# Patient Record
Sex: Male | Born: 1980 | Race: Black or African American | Hispanic: No | Marital: Single | Smoking: Current every day smoker
Health system: Southern US, Community
[De-identification: ages and names within clinical notes are randomized; demographics above are authoritative.]

---

## 2007-11-16 ENCOUNTER — Emergency Department (HOSPITAL_COMMUNITY): Admission: EM | Admit: 2007-11-16 | Discharge: 2007-11-16 | Payer: Self-pay | Admitting: Emergency Medicine

## 2008-08-03 ENCOUNTER — Emergency Department (HOSPITAL_COMMUNITY): Admission: EM | Admit: 2008-08-03 | Discharge: 2008-08-03 | Payer: Self-pay | Admitting: Emergency Medicine

## 2008-09-11 ENCOUNTER — Emergency Department (HOSPITAL_COMMUNITY): Admission: EM | Admit: 2008-09-11 | Discharge: 2008-09-11 | Payer: Self-pay | Admitting: Emergency Medicine

## 2008-10-01 ENCOUNTER — Emergency Department (HOSPITAL_COMMUNITY): Admission: EM | Admit: 2008-10-01 | Discharge: 2008-10-01 | Payer: Self-pay | Admitting: Emergency Medicine

## 2009-01-02 ENCOUNTER — Emergency Department (HOSPITAL_COMMUNITY): Admission: EM | Admit: 2009-01-02 | Discharge: 2009-01-02 | Payer: Self-pay | Admitting: Emergency Medicine

## 2009-08-19 ENCOUNTER — Emergency Department (HOSPITAL_COMMUNITY): Admission: EM | Admit: 2009-08-19 | Discharge: 2009-08-19 | Payer: Self-pay | Admitting: Emergency Medicine

## 2009-08-20 ENCOUNTER — Inpatient Hospital Stay (HOSPITAL_COMMUNITY): Admission: EM | Admit: 2009-08-20 | Discharge: 2009-08-22 | Payer: Self-pay | Admitting: Emergency Medicine

## 2009-12-08 ENCOUNTER — Emergency Department (HOSPITAL_COMMUNITY): Admission: EM | Admit: 2009-12-08 | Discharge: 2009-12-08 | Payer: Self-pay | Admitting: Emergency Medicine

## 2010-04-05 ENCOUNTER — Emergency Department (HOSPITAL_COMMUNITY)
Admission: EM | Admit: 2010-04-05 | Discharge: 2010-04-05 | Payer: Self-pay | Source: Home / Self Care | Admitting: Emergency Medicine

## 2010-07-18 ENCOUNTER — Emergency Department (HOSPITAL_COMMUNITY)
Admission: EM | Admit: 2010-07-18 | Discharge: 2010-07-19 | Payer: Self-pay | Source: Home / Self Care | Admitting: Emergency Medicine

## 2010-09-12 LAB — POCT I-STAT, CHEM 8
BUN: 10 mg/dL (ref 6–23)
Calcium, Ion: 0.97 mmol/L — ABNORMAL LOW (ref 1.12–1.32)
Glucose, Bld: 79 mg/dL (ref 70–99)
Hemoglobin: 16.7 g/dL (ref 13.0–17.0)
Sodium: 140 mEq/L (ref 135–145)
TCO2: 21 mmol/L (ref 0–100)

## 2010-09-12 LAB — URINALYSIS, ROUTINE W REFLEX MICROSCOPIC
Bilirubin Urine: NEGATIVE
Glucose, UA: NEGATIVE mg/dL
Glucose, UA: NEGATIVE mg/dL
Hgb urine dipstick: NEGATIVE
Ketones, ur: 15 mg/dL — AB
Ketones, ur: NEGATIVE mg/dL
Nitrite: NEGATIVE
Nitrite: NEGATIVE
Specific Gravity, Urine: 1.015 (ref 1.005–1.030)
Specific Gravity, Urine: 1.026 (ref 1.005–1.030)
Urobilinogen, UA: 0.2 mg/dL (ref 0.0–1.0)
Urobilinogen, UA: 0.2 mg/dL (ref 0.0–1.0)
pH: 6 (ref 5.0–8.0)
pH: 8.5 — ABNORMAL HIGH (ref 5.0–8.0)
pH: 8.5 — ABNORMAL HIGH (ref 5.0–8.0)

## 2010-09-12 LAB — CBC
HCT: 35.3 % — ABNORMAL LOW (ref 39.0–52.0)
MCHC: 34.9 g/dL (ref 30.0–36.0)
MCV: 96 fL (ref 78.0–100.0)
MCV: 96.5 fL (ref 78.0–100.0)
Platelets: 174 10*3/uL (ref 150–400)
Platelets: 253 10*3/uL (ref 150–400)
RBC: 3.59 MIL/uL — ABNORMAL LOW (ref 4.22–5.81)
RDW: 13.5 % (ref 11.5–15.5)
RDW: 13.5 % (ref 11.5–15.5)
WBC: 5.5 10*3/uL (ref 4.0–10.5)
WBC: 7.4 10*3/uL (ref 4.0–10.5)

## 2010-09-12 LAB — COMPREHENSIVE METABOLIC PANEL
AST: 20 U/L (ref 0–37)
Albumin: 4.2 g/dL (ref 3.5–5.2)
Alkaline Phosphatase: 80 U/L (ref 39–117)
BUN: 9 mg/dL (ref 6–23)
Creatinine, Ser: 1.21 mg/dL (ref 0.4–1.5)
Potassium: 3.1 mEq/L — ABNORMAL LOW (ref 3.5–5.1)
Sodium: 139 mEq/L (ref 135–145)
Total Bilirubin: 0.5 mg/dL (ref 0.3–1.2)

## 2010-09-12 LAB — BASIC METABOLIC PANEL
BUN: 2 mg/dL — ABNORMAL LOW (ref 6–23)
BUN: 5 mg/dL — ABNORMAL LOW (ref 6–23)
BUN: 6 mg/dL (ref 6–23)
BUN: 8 mg/dL (ref 6–23)
Calcium: 7.2 mg/dL — ABNORMAL LOW (ref 8.4–10.5)
Chloride: 105 mEq/L (ref 96–112)
Chloride: 105 mEq/L (ref 96–112)
Chloride: 111 mEq/L (ref 96–112)
Chloride: 112 mEq/L (ref 96–112)
Creatinine, Ser: 0.91 mg/dL (ref 0.4–1.5)
Creatinine, Ser: 1.17 mg/dL (ref 0.4–1.5)
GFR calc Af Amer: >60 mL/min (ref >60)
GFR calc Af Amer: >60 mL/min (ref >60)
GFR calc Af Amer: >60 mL/min (ref >60)
GFR calc non Af Amer: >60 mL/min (ref >60)
GFR calc non Af Amer: >60 mL/min (ref >60)
GFR calc non Af Amer: >60 mL/min (ref >60)
GFR calc non Af Amer: >60 mL/min (ref >60)
Glucose, Bld: 106 mg/dL — ABNORMAL HIGH (ref 70–99)
Glucose, Bld: 115 mg/dL — ABNORMAL HIGH (ref 70–99)
Potassium: 3 mEq/L — ABNORMAL LOW (ref 3.5–5.1)
Potassium: 3.4 mEq/L — ABNORMAL LOW (ref 3.5–5.1)
Potassium: 3.5 mEq/L (ref 3.5–5.1)
Potassium: 3.7 mEq/L (ref 3.5–5.1)
Potassium: 4 mEq/L (ref 3.5–5.1)
Sodium: 141 mEq/L (ref 135–145)
Sodium: 142 mEq/L (ref 135–145)
Sodium: 143 mEq/L (ref 135–145)

## 2010-09-12 LAB — POCT I-STAT 3, ART BLOOD GAS (G3+)
Acid-base deficit: 2 mmol/L (ref 0.0–2.0)
Bicarbonate: 18.4 mEq/L — ABNORMAL LOW (ref 20.0–24.0)

## 2010-09-12 LAB — SALICYLATE LEVEL
Salicylate Lvl: 12.8 mg/dL (ref 2.8–20.0)
Salicylate Lvl: 24.9 mg/dL — ABNORMAL HIGH (ref 2.8–20.0)
Salicylate Lvl: 36.3 mg/dL (ref 2.8–20.0)
Salicylate Lvl: 45.1 mg/dL (ref 2.8–20.0)
Salicylate Lvl: 52.1 mg/dL (ref 2.8–20.0)
Salicylate Lvl: 9.4 mg/dL (ref 2.8–20.0)

## 2010-09-12 LAB — RAPID URINE DRUG SCREEN, HOSP PERFORMED
Barbiturates: NOT DETECTED
Opiates: NOT DETECTED

## 2010-09-12 LAB — GLUCOSE, CAPILLARY

## 2010-09-12 LAB — MAGNESIUM: Magnesium: 1.7 mg/dL (ref 1.5–2.5)

## 2010-09-12 LAB — DIFFERENTIAL
Basophils Absolute: 0 10*3/uL (ref 0.0–0.1)
Basophils Relative: 1 % (ref 0–1)
Eosinophils Absolute: 0.1 10*3/uL (ref 0.0–0.7)
Lymphocytes Relative: 52 % — ABNORMAL HIGH (ref 12–46)
Lymphs Abs: 2.8 10*3/uL (ref 0.7–4.0)
Monocytes Relative: 12 % (ref 3–12)
Neutro Abs: 4 10*3/uL (ref 1.7–7.7)
Neutrophils Relative %: 33 % — ABNORMAL LOW (ref 43–77)

## 2010-09-12 LAB — MRSA PCR SCREENING: MRSA by PCR: NEGATIVE

## 2010-09-12 LAB — APTT: aPTT: 29 seconds (ref 24–37)

## 2010-09-12 LAB — LACTIC ACID, PLASMA: Lactic Acid, Venous: 0.7 mmol/L (ref 0.5–2.2)

## 2010-09-12 LAB — LIPASE, BLOOD: Lipase: 19 U/L (ref 11–59)

## 2010-09-12 LAB — HEMOCCULT GUIAC POC 1CARD (OFFICE): Fecal Occult Bld: POSITIVE

## 2010-09-12 LAB — URINE CULTURE: Colony Count: NO GROWTH

## 2010-09-12 LAB — ACETAMINOPHEN LEVEL: Acetaminophen (Tylenol), Serum: <10 ug/mL — ABNORMAL LOW (ref 10–30)

## 2011-10-04 ENCOUNTER — Encounter (HOSPITAL_COMMUNITY): Payer: Self-pay | Admitting: Emergency Medicine

## 2011-10-04 ENCOUNTER — Emergency Department (HOSPITAL_COMMUNITY)
Admission: EM | Admit: 2011-10-04 | Discharge: 2011-10-05 | Disposition: A | Discharge status: Home / Self Care | Payer: Self-pay | Attending: Emergency Medicine | Admitting: Emergency Medicine

## 2011-10-04 DIAGNOSIS — G43909 Migraine, unspecified, not intractable, without status migrainosus: Secondary | ICD-10-CM | POA: Insufficient documentation

## 2011-10-04 NOTE — ED Notes (Signed)
PT. REPORTS PERSISTENT MIGRAINE HEADACHE FOR SEVERAL DAYS .

## 2012-01-09 ENCOUNTER — Emergency Department (HOSPITAL_COMMUNITY)
Admission: EM | Admit: 2012-01-09 | Discharge: 2012-01-09 | Disposition: A | Discharge status: Home / Self Care | Payer: Self-pay | Attending: Emergency Medicine | Admitting: Emergency Medicine

## 2012-01-09 ENCOUNTER — Encounter (HOSPITAL_COMMUNITY): Payer: Self-pay | Admitting: Emergency Medicine

## 2012-01-09 DIAGNOSIS — G43909 Migraine, unspecified, not intractable, without status migrainosus: Secondary | ICD-10-CM | POA: Insufficient documentation

## 2012-01-09 DIAGNOSIS — F17200 Nicotine dependence, unspecified, uncomplicated: Secondary | ICD-10-CM | POA: Insufficient documentation

## 2012-01-09 DIAGNOSIS — H6690 Otitis media, unspecified, unspecified ear: Secondary | ICD-10-CM | POA: Insufficient documentation

## 2012-01-09 DIAGNOSIS — H9202 Otalgia, left ear: Secondary | ICD-10-CM

## 2012-01-09 MED ORDER — AMOXICILLIN 250 MG PO CAPS: 250.0000 mg | ORAL_CAPSULE | Freq: Two times a day (BID) | ORAL | Status: AC

## 2012-01-09 MED ORDER — IBUPROFEN 800 MG PO TABS: 800.0000 mg | ORAL_TABLET | Freq: Three times a day (TID) | ORAL | Status: AC | PRN

## 2012-01-09 MED ORDER — IBUPROFEN 400 MG PO TABS
800.0000 mg | ORAL_TABLET | Freq: Once | ORAL | Status: AC
  Administered 2012-01-09: 800 mg via ORAL
  Filled 2012-01-09: qty 2

## 2012-01-09 NOTE — ED Provider Notes (Signed)
History     CSN: [id number]  Arrival date & time 01/09/12  1947   First MD Initiated Contact with Patient 01/09/12 2011      Chief Complaint  Patient presents with  . Headache  . Blurred Vision  . Otalgia    (Consider location/radiation/quality/duration/timing/severity/associated sxs/prior treatment) HPI Comments: Patient with a history of migraines presents emergency department with chief complaint of migraine.  He states that presentation is similar to previous migraines including mild nausea, blurred vision, and headaches.  He denies any emesis, fever, night sweats or chills.  The only thing occurring that is slightly different is that he is having some left ear otalgia.  Your pain began 2 days ago and is associated with decreased ability to hear.  Patient denies any congestion, rhinorrhea, cough, shortness of breath, chest pain, ataxia, disequilibrium, vertigo.  Patient is a 31 y.o. male presenting with headaches and ear pain. The history is provided by the patient.  Headache  Associated symptoms include nausea.  Otalgia Associated symptoms include headaches.    Past Medical History  Diagnosis Date  . Migraine     History reviewed. No pertinent past surgical history.  History reviewed. No pertinent family history.  History  Substance Use Topics  . Smoking status: Current Everyday Smoker  . Smokeless tobacco: Not on file  . Alcohol Use: Yes      Review of Systems  HENT: Positive for ear pain.   Gastrointestinal: Positive for nausea.  Neurological: Positive for headaches.  All other systems reviewed and are negative.    Allergies  Review of patient's allergies indicates no known allergies.  Home Medications   Current Outpatient Rx  Name Route Sig Dispense Refill  . NAPROXEN SODIUM 220 MG PO TABS Oral Take 220 mg by mouth 3 (three) times daily as needed. For pain      BP 104/71  Pulse 69  Temp 98.7 F (37.1 C) (Oral)  Resp 18  SpO2 99%  Physical  Exam  Nursing note and vitals reviewed. Constitutional: He is oriented to person, place, and time. He appears well-developed and well-nourished. No distress.  HENT:  Head: Normocephalic and atraumatic.       Right tympanic membrane unable to be visualized due to cerumen impaction.  Left tympanic membrane bulging and erythematous.  External ear canals are normal bilaterally.  No tragal or mastoid tenderness.  Finger rub heard bilaterally Frontal and maxillary sinuses nontender to palpation.  Nose normal  Eyes: Conjunctivae and EOM are normal.  Neck: Normal range of motion.  Pulmonary/Chest: Effort normal.  Musculoskeletal: Normal range of motion.  Neurological: He is alert and oriented to person, place, and time.       Cranial nerves III through XII intact good coordination and normal gait  Skin: Skin is warm and dry. No rash noted. He is not diaphoretic.  Psychiatric: He has a normal mood and affect. His behavior is normal.    ED Course  Procedures (including critical care time)  Labs Reviewed - No data to display No results found.   No diagnosis found.    MDM  Otitis media and migraine  Patient advised to followup with ENT if your symptoms persist.  Patient will be treated with an antibiotic.Pt HA treated and improved while in ED.  Presentation is like pts typical HA and non concerning for [facility], ICH, Meningitis, or temporal arteritis. Pt is afebrile with no focal neuro deficits, nuchal rigidity, or change in vision. Pt is to follow up with  PCP to discuss prophylactic medication. Pt verbalizes understanding and is agreeable with plan to dc.          Jaci Carrel, [place] 01/09/12 2112

## 2012-01-09 NOTE — ED Provider Notes (Signed)
Medical screening examination/treatment/procedure(s) were performed by non-physician practitioner and as supervising physician I was immediately available for consultation/collaboration.  Azaiah Licciardi, MD 01/09/12 2351 

## 2012-01-09 NOTE — ED Notes (Addendum)
Pt reports headache that have occurred for years but admits to recent blurred vision and L ear pain with decreased ability to hear

## 2012-07-03 ENCOUNTER — Other Ambulatory Visit: Payer: Self-pay

## 2012-07-03 ENCOUNTER — Emergency Department (HOSPITAL_COMMUNITY)
Admission: EM | Admit: 2012-07-03 | Discharge: 2012-07-03 | Disposition: A | Discharge status: Home / Self Care | Payer: Self-pay | Attending: Emergency Medicine | Admitting: Emergency Medicine

## 2012-07-03 ENCOUNTER — Encounter (HOSPITAL_COMMUNITY): Payer: Self-pay | Admitting: Emergency Medicine

## 2012-07-03 DIAGNOSIS — F17200 Nicotine dependence, unspecified, uncomplicated: Secondary | ICD-10-CM | POA: Insufficient documentation

## 2012-07-03 DIAGNOSIS — L0390 Cellulitis, unspecified: Secondary | ICD-10-CM

## 2012-07-03 DIAGNOSIS — Z8679 Personal history of other diseases of the circulatory system: Secondary | ICD-10-CM | POA: Insufficient documentation

## 2012-07-03 DIAGNOSIS — N61 Mastitis without abscess: Secondary | ICD-10-CM | POA: Insufficient documentation

## 2012-07-03 MED ORDER — HYDROCODONE-ACETAMINOPHEN 5-325 MG PO TABS: 1.0000 | ORAL_TABLET | ORAL | Status: DC | PRN

## 2012-07-03 MED ORDER — SULFAMETHOXAZOLE-TRIMETHOPRIM 800-160 MG PO TABS: 1.0000 | ORAL_TABLET | Freq: Two times a day (BID) | ORAL | Status: DC

## 2012-07-03 NOTE — ED Provider Notes (Signed)
Medical screening examination/treatment/procedure(s) were performed by non-physician practitioner and as supervising physician I was immediately available for consultation/collaboration.   Oron Westrup L Erhard Senske, MD 07/03/12 2323 

## 2012-07-03 NOTE — ED Provider Notes (Signed)
History   This chart was scribed for non-physician practitioner working with Flint Melter, MD by Gerlean Ren, ED Scribe. This patient was seen in room TR05C/TR05C and the patient's care was started at 3:49 PM.    CSN: [id number]  Arrival date & time 07/03/12  1348   First MD Initiated Contact with Patient 07/03/12 1516      Chief Complaint  Patient presents with  . Wound Check     The history is provided by the patient. No language interpreter was used.   Thomas Ali is a 32 y.o. male who presents to the Emergency Department complaining of one week of pain over left nipple area that waxes-and-wanes in severity but is constantly present and occasionally radiates to the mid-chest.  No discharge from nipple noted.  Pt denies dyspnea, fever, nausea, emesis.  Pt is a current everyday smoker and reports alcohol use.   Past Medical History  Diagnosis Date  . Migraine     No past surgical history on file.  No family history on file.  History  Substance Use Topics  . Smoking status: Current Every Day Smoker  . Smokeless tobacco: Not on file  . Alcohol Use: Yes      Review of Systems  Constitutional: Negative for fever.  Respiratory: Negative for shortness of breath.   Gastrointestinal: Negative for nausea and vomiting.  Musculoskeletal:       Chest tenderness    Allergies  Review of patient's allergies indicates no known allergies.  Home Medications   Current Outpatient Rx  Name  Route  Sig  Dispense  Refill  . ACETAMINOPHEN 325 MG PO TABS   Oral   Take 650 mg by mouth every 6 (six) hours as needed. For pain           BP 130/84  Pulse 82  Temp 98.9 F (37.2 C)  Resp 16  Physical Exam  Nursing note and vitals reviewed. Constitutional: He is oriented to person, place, and time. He appears well-developed and well-nourished. No distress.  HENT:  Head: Normocephalic and atraumatic.  Eyes: EOM are normal.  Neck: Neck supple. No tracheal deviation  present.  Cardiovascular: Normal rate.   No murmur heard. Pulmonary/Chest: Effort normal. No respiratory distress. He exhibits tenderness.  Musculoskeletal: Normal range of motion.       Left areola is firm without induration or fluctuance, surrounding area erythematous, no nipple discharge or bleeding, no axillary lymphadenopathy, suspect early abscess or cellulitis  Neurological: He is alert and oriented to person, place, and time.  Skin: Skin is warm and dry.  Psychiatric: He has a normal mood and affect. His behavior is normal.    ED Course  Procedures (including critical care time) DIAGNOSTIC STUDIES: No O2 stat. COORDINATION OF CARE: 3:52 PM- Patient informed of clinical course, understands medical decision-making process, and agrees with plan.  Informed pt to apply warm compress and to return if symptoms continue to worsen or if fever begins.   No diagnosis found.  1. Left breast cellulitis  MDM  Cellulitis vs early abscess left breast  I personally performed the services described in this documentation, which was scribed in my presence. The recorded information has been reviewed and is accurate.      Arnoldo Hooker, PA-C 07/03/12 1608

## 2012-07-03 NOTE — ED Notes (Signed)
Pt given discharge paperwork; no additional questions by pt; pt verbalized understanding of discharge; e-signature obtained; VSS;

## 2012-07-03 NOTE — ED Notes (Signed)
States that he has lump on his chest that goes and comes swells  X 1 week  And has been having chest pain also x 2 days

## 2012-10-19 ENCOUNTER — Encounter (HOSPITAL_COMMUNITY): Payer: Self-pay | Admitting: Nurse Practitioner

## 2012-10-19 ENCOUNTER — Emergency Department (HOSPITAL_COMMUNITY)
Admission: EM | Admit: 2012-10-19 | Discharge: 2012-10-19 | Disposition: A | Discharge status: Home / Self Care | Payer: Self-pay | Attending: Emergency Medicine | Admitting: Emergency Medicine

## 2012-10-19 DIAGNOSIS — Z8679 Personal history of other diseases of the circulatory system: Secondary | ICD-10-CM | POA: Insufficient documentation

## 2012-10-19 DIAGNOSIS — H53149 Visual discomfort, unspecified: Secondary | ICD-10-CM | POA: Insufficient documentation

## 2012-10-19 DIAGNOSIS — R110 Nausea: Secondary | ICD-10-CM | POA: Insufficient documentation

## 2012-10-19 DIAGNOSIS — R51 Headache: Secondary | ICD-10-CM | POA: Insufficient documentation

## 2012-10-19 DIAGNOSIS — R5381 Other malaise: Secondary | ICD-10-CM | POA: Insufficient documentation

## 2012-10-19 DIAGNOSIS — F17200 Nicotine dependence, unspecified, uncomplicated: Secondary | ICD-10-CM | POA: Insufficient documentation

## 2012-10-19 MED ORDER — METOCLOPRAMIDE HCL 5 MG/ML IJ SOLN
10.0000 mg | Freq: Once | INTRAMUSCULAR | Status: AC
  Administered 2012-10-19: 10 mg via INTRAVENOUS
  Filled 2012-10-19: qty 2

## 2012-10-19 MED ORDER — SODIUM CHLORIDE 0.9 % IV BOLUS (SEPSIS)
500.0000 mL | Freq: Once | INTRAVENOUS | Status: AC
  Administered 2012-10-19: 500 mL via INTRAVENOUS

## 2012-10-19 MED ORDER — DIPHENHYDRAMINE HCL 50 MG/ML IJ SOLN
25.0000 mg | Freq: Once | INTRAMUSCULAR | Status: AC
  Administered 2012-10-19: 25 mg via INTRAVENOUS
  Filled 2012-10-19: qty 1

## 2012-10-19 NOTE — ED Provider Notes (Signed)
History    This chart was scribed for Renne Crigler, a non-physician practitioner working with Lyanne Co, MD by Frederik Pear, ED Scribe. This patient was seen in room TR11C/TR11C and the patient's care was started at 1504.   CSN: [id number]  Arrival date & time 10/19/12  1437   First MD Initiated Contact with Patient 10/19/12 1504      Chief Complaint  Patient presents with  . Headache    (Consider location/radiation/quality/duration/timing/severity/associated sxs/prior treatment) The history is provided by the patient and medical records. No language interpreter was used.   Thomas Ali is a 32 y.o. male who presents to the Emergency Department complaining of a severe headache around the left eye and left side of his head that radiates into his left jaw that is aggravated by chewing, which has impacted his ability to eat, and alleviated by applying pressure to the area that began suddenly last night. He also complains of mild photophobia, generalized weakness (no focal) and nausea, but denies dental pain, emesis, or phonophobia. He has a h/o of frequent mild headaches, but reports that the most recent headache of this severity was 03/04/211 for which he was admitted to the hospital for the headache as well as overdosing on Tylenol and aspirin.  He denies at treatment at home. He states that his grandmother indicated that it could be related to his sinuses, but in the ED, he denies congestion and rhinorrhea. He has no chronic medical conditions that require daily medications.   Past Medical History  Diagnosis Date  . Migraine     History reviewed. No pertinent past surgical history.  History reviewed. No pertinent family history.  History  Substance Use Topics  . Smoking status: Current Every Day Smoker  . Smokeless tobacco: Not on file  . Alcohol Use: Yes      Review of Systems  Constitutional: Negative for fever.  HENT: Negative for congestion, sore throat,  rhinorrhea, neck pain, neck stiffness, dental problem and sinus pressure.        Denies phonophobia.  Eyes: Positive for photophobia. Negative for discharge, redness and visual disturbance.  Respiratory: Negative for shortness of breath.   Cardiovascular: Negative for chest pain.  Gastrointestinal: Positive for nausea. Negative for vomiting and diarrhea.  Genitourinary: Negative for dysuria.  Musculoskeletal: Negative for back pain and gait problem.  Skin: Negative for rash.  Neurological: Positive for headaches. Negative for dizziness, syncope, speech difficulty, weakness, light-headedness and numbness.  Psychiatric/Behavioral: Negative for confusion.    Allergies  Review of patient's allergies indicates no known allergies.  Home Medications  No current outpatient prescriptions on file.  BP 123/82  Pulse 64  Temp(Src) 98.6 F (37 C) (Oral)  Resp 20  SpO2 100%  Physical Exam  Nursing note and vitals reviewed. Constitutional: He is oriented to person, place, and time. He appears well-developed and well-nourished. No distress.  HENT:  Head: Normocephalic and atraumatic.  Right Ear: Tympanic membrane, external ear and ear canal normal.  Left Ear: Tympanic membrane, external ear and ear canal normal.  Nose: Nose normal.  Mouth/Throat: Uvula is midline, oropharynx is clear and moist and mucous membranes are normal.  Eyes: Conjunctivae, EOM and lids are normal. Pupils are equal, round, and reactive to light.  Neck: Normal range of motion. Neck supple. No tracheal deviation present.  Cardiovascular: Normal rate and regular rhythm.   Pulmonary/Chest: Effort normal and breath sounds normal. No respiratory distress.  Abdominal: Soft. He exhibits no distension. There is no  tenderness.  Musculoskeletal: Normal range of motion. He exhibits no edema.       Cervical back: He exhibits normal range of motion, no tenderness and no bony tenderness.  Neurological: He is alert and oriented to  person, place, and time. He has normal strength and normal reflexes. No cranial nerve deficit or sensory deficit. He exhibits normal muscle tone. He displays a negative Romberg sign. Coordination and gait normal. GCS eye subscore is 4. GCS verbal subscore is 5. GCS motor subscore is 6.  Sensations and motor skills of the lower extremities are intact.   Skin: Skin is warm and dry.  Psychiatric: He has a normal mood and affect. His behavior is normal.    ED Course  Procedures (including critical care time)  DIAGNOSTIC STUDIES: Oxygen Saturation is 100% on room air, normal by my interpretation.    COORDINATION OF CARE:  15:11- Discussed planned course of treatment with the patient, including Reglan, Benadrul, and IV fluids, who is agreeable at this time.  15:30- Medication Orders- metoclopramide (Reglan) injection 10 mg- once, diphenhydramine (Benadrul) injection 25 mg, once, sodium chloride 0.9% bolus 500 mL- once.  16:40- Recheck- He reports that his headache is at a 0/10. He now has a family member who with him who states that he has similar headaches that can last up to several hours a day on a regular basis.  Labs Reviewed - No data to display No results found.   1. Headache    Patient seen and examined. Work-up initiated. Medications ordered.   Vital signs reviewed and are as follows: Filed Vitals:   10/19/12 1440  BP: 123/82  Pulse: 64  Temp: 98.6 F (37 C)  Resp: 20   Urged PCP f/u for mgmt and eval of chronic HA given frequent HA.   Patient urged to return with worsening symptoms or other concerns. Patient verbalized understanding and agrees with plan.      MDM  Patient with typical HA however more severe. He has had HA's this severe in past as endorsed in previous charts. Patient does not endorse true thunderclap or head trauma. Neuro exam is normal. Unclear etiology. He would benefit from PCP for frequent HA. I do not suspect sentinel bleeding or SAH. I do not  suspect meningitis. Return instructions given.    I personally performed the services described in this documentation, which was scribed in my presence. The recorded information has been reviewed and is accurate.         Renne Crigler, PA-C 10/19/12 1713

## 2012-10-19 NOTE — ED Notes (Signed)
States he has had a headache 'for a long time." states today it hurts so bad he has lost his appetite. Reports history of headaches. denies any meds PTA

## 2012-10-21 NOTE — ED Provider Notes (Signed)
Medical screening examination/treatment/procedure(s) were performed by non-physician practitioner and as supervising physician I was immediately available for consultation/collaboration.  Lyanne Co, MD 10/21/12 [phone]

## 2012-12-15 ENCOUNTER — Emergency Department (HOSPITAL_COMMUNITY)
Admission: EM | Admit: 2012-12-15 | Discharge: 2012-12-15 | Disposition: A | Discharge status: Home / Self Care | Payer: Self-pay | Attending: Emergency Medicine | Admitting: Emergency Medicine

## 2012-12-15 ENCOUNTER — Encounter (HOSPITAL_COMMUNITY): Payer: Self-pay | Admitting: *Deleted

## 2012-12-15 DIAGNOSIS — S0990XA Unspecified injury of head, initial encounter: Secondary | ICD-10-CM | POA: Insufficient documentation

## 2012-12-15 DIAGNOSIS — F17200 Nicotine dependence, unspecified, uncomplicated: Secondary | ICD-10-CM | POA: Insufficient documentation

## 2012-12-15 DIAGNOSIS — Z23 Encounter for immunization: Secondary | ICD-10-CM | POA: Insufficient documentation

## 2012-12-15 DIAGNOSIS — R110 Nausea: Secondary | ICD-10-CM | POA: Insufficient documentation

## 2012-12-15 DIAGNOSIS — F29 Unspecified psychosis not due to a substance or known physiological condition: Secondary | ICD-10-CM | POA: Insufficient documentation

## 2012-12-15 DIAGNOSIS — IMO0002 Reserved for concepts with insufficient information to code with codable children: Secondary | ICD-10-CM | POA: Insufficient documentation

## 2012-12-15 DIAGNOSIS — Z8679 Personal history of other diseases of the circulatory system: Secondary | ICD-10-CM | POA: Insufficient documentation

## 2012-12-15 DIAGNOSIS — S86911A Strain of unspecified muscle(s) and tendon(s) at lower leg level, right leg, initial encounter: Secondary | ICD-10-CM

## 2012-12-15 DIAGNOSIS — S51809A Unspecified open wound of unspecified forearm, initial encounter: Secondary | ICD-10-CM | POA: Insufficient documentation

## 2012-12-15 DIAGNOSIS — T07XXXA Unspecified multiple injuries, initial encounter: Secondary | ICD-10-CM

## 2012-12-15 MED ORDER — IBUPROFEN 800 MG PO TABS: 800.0000 mg | ORAL_TABLET | Freq: Three times a day (TID) | ORAL | Status: DC

## 2012-12-15 MED ORDER — AMOXICILLIN-POT CLAVULANATE 875-125 MG PO TABS
1.0000 | ORAL_TABLET | Freq: Once | ORAL | Status: AC
  Administered 2012-12-15: 1 via ORAL
  Filled 2012-12-15: qty 1

## 2012-12-15 MED ORDER — OXYCODONE-ACETAMINOPHEN 5-325 MG PO TABS: 2.0000 | ORAL_TABLET | ORAL | Status: DC | PRN

## 2012-12-15 MED ORDER — OXYCODONE-ACETAMINOPHEN 5-325 MG PO TABS
1.0000 | ORAL_TABLET | Freq: Once | ORAL | Status: AC
  Administered 2012-12-15: 1 via ORAL
  Filled 2012-12-15: qty 1

## 2012-12-15 MED ORDER — AMOXICILLIN-POT CLAVULANATE 875-125 MG PO TABS: 1.0000 | ORAL_TABLET | Freq: Two times a day (BID) | ORAL | Status: DC

## 2012-12-15 MED ORDER — TETANUS-DIPHTH-ACELL PERTUSSIS 5-2.5-18.5 LF-MCG/0.5 IM SUSP
0.5000 mL | Freq: Once | INTRAMUSCULAR | Status: AC
  Administered 2012-12-15: 0.5 mL via INTRAMUSCULAR
  Filled 2012-12-15: qty 0.5
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
## 2012-12-15 NOTE — Progress Notes (Signed)
Orthopedic Tech Progress Note Patient Details:  Thomas Ali 04/12/1981 [id number]  Ortho Devices Type of Ortho Device: Knee Immobilizer;Crutches Ortho Device/Splint Location: RLE Ortho Device/Splint Interventions: Ordered;Application   Jennye Moccasin 12/15/2012, 7:46 PM

## 2012-12-22 ENCOUNTER — Emergency Department (HOSPITAL_COMMUNITY): Payer: Self-pay

## 2012-12-22 ENCOUNTER — Encounter (HOSPITAL_COMMUNITY): Payer: Self-pay | Admitting: *Deleted

## 2012-12-22 ENCOUNTER — Emergency Department (HOSPITAL_COMMUNITY)
Admission: EM | Admit: 2012-12-22 | Discharge: 2012-12-22 | Disposition: A | Discharge status: Home / Self Care | Payer: Self-pay | Attending: Emergency Medicine | Admitting: Emergency Medicine

## 2012-12-22 DIAGNOSIS — M25571 Pain in right ankle and joints of right foot: Secondary | ICD-10-CM

## 2012-12-22 DIAGNOSIS — Z87828 Personal history of other (healed) physical injury and trauma: Secondary | ICD-10-CM | POA: Insufficient documentation

## 2012-12-22 DIAGNOSIS — IMO0001 Reserved for inherently not codable concepts without codable children: Secondary | ICD-10-CM | POA: Insufficient documentation

## 2012-12-22 DIAGNOSIS — F17200 Nicotine dependence, unspecified, uncomplicated: Secondary | ICD-10-CM | POA: Insufficient documentation

## 2012-12-22 DIAGNOSIS — M25579 Pain in unspecified ankle and joints of unspecified foot: Secondary | ICD-10-CM | POA: Insufficient documentation

## 2012-12-22 DIAGNOSIS — Z8679 Personal history of other diseases of the circulatory system: Secondary | ICD-10-CM | POA: Insufficient documentation

## 2012-12-22 DIAGNOSIS — M7989 Other specified soft tissue disorders: Secondary | ICD-10-CM | POA: Insufficient documentation

## 2012-12-22 NOTE — ED Notes (Signed)
The pt has had rt lower leg pain since this am.  The only injury he has is injuring his rt knee last week
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
## 2012-12-23 NOTE — ED Provider Notes (Signed)
Medical screening examination/treatment/procedure(s) were performed by non-physician practitioner and as supervising physician I was immediately available for consultation/collaboration.   Clovia Reine Y. Raylinn Kosar, MD 12/23/12 1108 

## 2012-12-24 NOTE — ED Provider Notes (Signed)
Medical screening examination/treatment/procedure(s) were performed by non-physician practitioner and as supervising physician I was immediately available for consultation/collaboration.  Khamani Daniely R. Susi Goslin, MD 12/24/12 1520 

## 2012-12-25 ENCOUNTER — Emergency Department (HOSPITAL_COMMUNITY)
Admission: EM | Admit: 2012-12-25 | Discharge: 2012-12-25 | Disposition: A | Discharge status: Home / Self Care | Payer: Self-pay | Attending: Emergency Medicine | Admitting: Emergency Medicine

## 2012-12-25 ENCOUNTER — Encounter (HOSPITAL_COMMUNITY): Payer: Self-pay | Admitting: Family Medicine

## 2012-12-25 DIAGNOSIS — M25561 Pain in right knee: Secondary | ICD-10-CM

## 2012-12-25 DIAGNOSIS — Z8679 Personal history of other diseases of the circulatory system: Secondary | ICD-10-CM | POA: Insufficient documentation

## 2012-12-25 DIAGNOSIS — F17200 Nicotine dependence, unspecified, uncomplicated: Secondary | ICD-10-CM | POA: Insufficient documentation

## 2012-12-25 DIAGNOSIS — G8921 Chronic pain due to trauma: Secondary | ICD-10-CM | POA: Insufficient documentation

## 2012-12-25 DIAGNOSIS — M25469 Effusion, unspecified knee: Secondary | ICD-10-CM | POA: Insufficient documentation

## 2012-12-25 NOTE — ED Notes (Signed)
Patient states R knee is a lot better, but his employer wanted him to get checked out because of limp.

## 2012-12-25 NOTE — ED Provider Notes (Signed)
   History    CSN: [id number] Arrival date & time 12/25/12  1238  First MD Initiated Contact with Patient 12/25/12 1307     Chief Complaint  Patient presents with  . Knee Pain   (Consider location/radiation/quality/duration/timing/severity/associated sxs/prior Treatment) HPI Thomas Ali is a 32 y.o. male who presents to ED with complaint of knee stiffness. Stats he injured it a week and a half ago. States it is feeling well except for some pain first thing is the morning and when he walks states he has a "limp" due to tightness in the back of the knee. States today was sent by his job to get "limp checked out."  Pt states he really does not have any pain. State taking ibuprofen for pain as needed. No other complaints. No new injuries.   Past Medical History  Diagnosis Date  . Migraine    History reviewed. No pertinent past surgical history. History reviewed. No pertinent family history. History  Substance Use Topics  . Smoking status: Current Every Day Smoker  . Smokeless tobacco: Not on file  . Alcohol Use: Yes    Review of Systems  Constitutional: Negative for fever and chills.  Respiratory: Negative.   Cardiovascular: Negative.   Musculoskeletal: Positive for joint swelling and arthralgias.  Skin: Negative.   Neurological: Negative for weakness and numbness.    Allergies  Review of patient's allergies indicates no known allergies.  Home Medications   Current Outpatient Rx  Name  Route  Sig  Dispense  Refill  . Ibuprofen (IBU PO)   Oral   Take 3 tablets by mouth every 6 (six) hours as needed (pain).          BP 111/68  Pulse 67  Temp(Src) 97.3 F (36.3 C)  Resp 18  SpO2 98% Physical Exam  Nursing note and vitals reviewed. Constitutional: He appears well-developed and well-nourished. No distress.  Musculoskeletal:  Normal appearing right knee.  No knee tenderness to palpation. Full ROM of the knee joint. Pain with full flexion and extension. Negative  anterior and posterior drawer signs. No laxity or pain with medial or lateral stress. Pain behind knee with straight leg raise   Neurological: He is alert.  Skin: Skin is warm and dry.    ED Course  Procedures (including critical care time) Labs Reviewed - No data to display No results found.  1. Knee pain, right     MDM  Pt with knee tightness in the posterior knee joint. No pain with flexion or extension. Hamstring muscle appears tight. Joint is stable. Instructed to continue to ice, ibuprofen. Stretches given. Follow up with orthopedics as needed.   Filed Vitals:   12/25/12 1248  BP: 111/68  Pulse: 67  Temp: 97.3 F (36.3 C)  Resp: 18     [name], PA-C 12/25/12 1414

## 2012-12-25 NOTE — ED Notes (Signed)
Per pt sts pain in right knee. sts was told he had some torn ligaments in his knee. sts there is some swelling. sts job sent here here because he has a limp and they want to make sure it is healing.

## 2012-12-26 NOTE — ED Provider Notes (Signed)
Medical screening examination/treatment/procedure(s) were performed by non-physician practitioner and as supervising physician I was immediately available for consultation/collaboration.   Carleene Cooper III, MD 12/26/12 [phone]

## 2013-03-11 ENCOUNTER — Encounter (HOSPITAL_COMMUNITY): Payer: Self-pay | Admitting: Emergency Medicine

## 2013-03-11 ENCOUNTER — Emergency Department (HOSPITAL_COMMUNITY)
Admission: EM | Admit: 2013-03-11 | Discharge: 2013-03-11 | Disposition: A | Discharge status: Home / Self Care | Payer: Self-pay | Attending: Emergency Medicine | Admitting: Emergency Medicine

## 2013-03-11 DIAGNOSIS — F17200 Nicotine dependence, unspecified, uncomplicated: Secondary | ICD-10-CM | POA: Insufficient documentation

## 2013-03-11 DIAGNOSIS — H9209 Otalgia, unspecified ear: Secondary | ICD-10-CM | POA: Insufficient documentation

## 2013-03-11 DIAGNOSIS — H9202 Otalgia, left ear: Secondary | ICD-10-CM

## 2013-03-11 DIAGNOSIS — Z8679 Personal history of other diseases of the circulatory system: Secondary | ICD-10-CM | POA: Insufficient documentation

## 2013-03-11 DIAGNOSIS — L02219 Cutaneous abscess of trunk, unspecified: Secondary | ICD-10-CM | POA: Insufficient documentation

## 2013-03-11 DIAGNOSIS — L02213 Cutaneous abscess of chest wall: Secondary | ICD-10-CM

## 2013-03-11 MED ORDER — SULFAMETHOXAZOLE-TRIMETHOPRIM 800-160 MG PO TABS: 1.0000 | ORAL_TABLET | Freq: Two times a day (BID) | ORAL | Status: DC

## 2013-03-11 MED ORDER — ANTIPYRINE-BENZOCAINE 5.4-1.4 % OT SOLN
3.0000 [drp] | OTIC | Status: DC | PRN
  Administered 2013-03-11: 3 [drp] via OTIC
  Filled 2013-03-11: qty 10

## 2013-03-11 NOTE — ED Notes (Signed)
Pt is c/o left ear pain that started yesterday, pt also has a red  area to the left nipple area that has been there about 1 week, pt uses ear lugs at work

## 2013-03-11 NOTE — ED Notes (Signed)
Pt alert and ambulatory upon discharge, dc'd home with all belongings, pt verbalizes understanding of discharge instructions, 1 new rx given and 1 rx sent home with pt

## 2013-03-11 NOTE — ED Provider Notes (Signed)
CSN: [id number]     Arrival date & time 03/11/13  1338 History  This chart was scribed for Felicie Morn, NP, working with Juliet Rude. Rubin Payor, MD by Blanchard Kelch, ED Scribe. This patient was seen in room TR08C/TR08C and the patient's care was started at 3:03 PM.    Chief Complaint  Patient presents with  . Otalgia  . Abscess    Patient is a 32 y.o. male presenting with ear pain and abscess. The history is provided by the patient. No language interpreter was used.  Otalgia Location:  Left Onset quality:  Sudden Duration:  3 days Timing:  Constant Chronicity:  New Associated symptoms: no fever   Abscess Location:  Torso Torso abscess location:  L chest Abscess quality: painful   Duration:  1 week Chronicity:  New Associated symptoms: no fever     HPI Comments: Thomas Ali is a 31 y.o. male who presents to the Emergency Department complaining of constant, painful abscess on left chest near his nipple that began about a week ago. He has a history of abscesses, with the last one being on his left leg. He also complains of left ear pain that began three days ago. He uses ear plugs while at work. He denies fever.   Past Medical History  Diagnosis Date  . Migraine    History reviewed. No pertinent past surgical history. No family history on file. History  Substance Use Topics  . Smoking status: Current Every Day Smoker  . Smokeless tobacco: Not on file  . Alcohol Use: Yes    Review of Systems  Constitutional: Negative for fever.  HENT: Positive for ear pain.   All other systems reviewed and are negative.    Allergies  Review of patient's allergies indicates no known allergies.  Home Medications   Current Outpatient Rx  Name  Route  Sig  Dispense  Refill  . Aspirin-Salicylamide-Caffeine (BC HEADACHE POWDER PO)   Oral   Take 1 each by mouth daily as needed (pain).          Triage Vitals: BP 116/79  Pulse 69  Temp(Src) 97.6 F (36.4 C) (Oral)  Resp 16   SpO2 100%  Physical Exam  Nursing note and vitals reviewed. Constitutional: He is oriented to person, place, and time. He appears well-developed and well-nourished. No distress.  HENT:  Head: Normocephalic and atraumatic.  Right Ear: Tympanic membrane and ear canal normal.  Left Ear: Tympanic membrane and ear canal normal.  Wax buildup in right canal.  Eyes: EOM are normal.  Neck: Neck supple. No tracheal deviation present.  Cardiovascular: Normal rate and regular rhythm.   Pulmonary/Chest: Effort normal and breath sounds normal. No respiratory distress.  Musculoskeletal: Normal range of motion.  Neurological: He is alert and oriented to person, place, and time.  Skin: Skin is warm and dry.  Abscess on left chest near nipple.  Psychiatric: He has a normal mood and affect. His behavior is normal.    ED Course  Procedures (including critical care time)  DIAGNOSTIC STUDIES: Oxygen Saturation is 100% on room air, normal by my interpretation.    COORDINATION OF CARE:  3:08 PM -Will perform I&D. Patient verbalizes understanding and agrees with treatment plan.   INCISION AND DRAINAGE Performed by: Felicie Morn, NP Consent: Verbal consent obtained. Risks and benefits: risks, benefits and alternatives were discussed Type: abscess  Body area: left chest wall adjacent to areola Anesthesia: local infiltration  Incision was made with a scalpel.  Local  anesthetic: lidocaine 2%   Anesthetic total: 3ml  Complexity: complex  Blunt dissection to break up loculations  Drainage: purulent  Drainage amount: copious  Patient tolerance: Patient tolerated the procedure well with no immediate complications.     Labs Review Labs Reviewed - No data to display Imaging Review No results found.  MDM  Chest wall abscess. Otalgia.   I personally performed the services described in this documentation, which was scribed in my presence. The recorded information has been reviewed and  is accurate.    Jimmye Norman, NP 03/11/13 [phone]

## 2013-03-12 NOTE — ED Provider Notes (Signed)
Medical screening examination/treatment/procedure(s) were performed by non-physician practitioner and as supervising physician I was immediately available for consultation/collaboration.  Juliet Rude. Rubin Payor, MD 03/12/13 4540

## 2014-01-26 ENCOUNTER — Emergency Department (HOSPITAL_COMMUNITY): Payer: BC Managed Care – PPO

## 2014-01-26 ENCOUNTER — Encounter (HOSPITAL_COMMUNITY): Payer: Self-pay | Admitting: Emergency Medicine

## 2014-01-26 ENCOUNTER — Emergency Department (HOSPITAL_COMMUNITY)
Admission: EM | Admit: 2014-01-26 | Discharge: 2014-01-26 | Disposition: A | Discharge status: Home / Self Care | Payer: BC Managed Care – PPO | Attending: Emergency Medicine | Admitting: Emergency Medicine

## 2014-01-26 DIAGNOSIS — Y9289 Other specified places as the place of occurrence of the external cause: Secondary | ICD-10-CM | POA: Diagnosis not present

## 2014-01-26 DIAGNOSIS — Z87891 Personal history of nicotine dependence: Secondary | ICD-10-CM | POA: Insufficient documentation

## 2014-01-26 DIAGNOSIS — M25531 Pain in right wrist: Secondary | ICD-10-CM

## 2014-01-26 DIAGNOSIS — Z7982 Long term (current) use of aspirin: Secondary | ICD-10-CM | POA: Insufficient documentation

## 2014-01-26 DIAGNOSIS — Z79899 Other long term (current) drug therapy: Secondary | ICD-10-CM | POA: Insufficient documentation

## 2014-01-26 DIAGNOSIS — S59909A Unspecified injury of unspecified elbow, initial encounter: Secondary | ICD-10-CM | POA: Insufficient documentation

## 2014-01-26 DIAGNOSIS — S59919A Unspecified injury of unspecified forearm, initial encounter: Principal | ICD-10-CM

## 2014-01-26 DIAGNOSIS — S6990XA Unspecified injury of unspecified wrist, hand and finger(s), initial encounter: Secondary | ICD-10-CM | POA: Insufficient documentation

## 2014-01-26 DIAGNOSIS — G43909 Migraine, unspecified, not intractable, without status migrainosus: Secondary | ICD-10-CM | POA: Diagnosis not present

## 2014-01-26 DIAGNOSIS — Y9389 Activity, other specified: Secondary | ICD-10-CM | POA: Insufficient documentation

## 2014-01-26 DIAGNOSIS — W240XXA Contact with lifting devices, not elsewhere classified, initial encounter: Secondary | ICD-10-CM | POA: Diagnosis not present

## 2014-01-26 MED ORDER — OXYCODONE-ACETAMINOPHEN 5-325 MG PO TABS
1.0000 | ORAL_TABLET | Freq: Once | ORAL | Status: AC
  Administered 2014-01-26: 1 via ORAL
  Filled 2014-01-26: qty 1

## 2014-01-26 MED ORDER — GABAPENTIN 300 MG PO CAPS: ORAL_CAPSULE | ORAL | Status: DC

## 2014-01-26 NOTE — ED Notes (Signed)
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 States pain, no swelling, can move all fingers.

## 2014-01-26 NOTE — Progress Notes (Signed)
Orthopedic Tech Progress Note Patient Details:  Thomas Ali Z Ayer 1981/03/02 [id number]  Ortho Devices Type of Ortho Device: Velcro wrist splint Ortho Device/Splint Location: rue Ortho Device/Splint Interventions: Application   Nikki DomCrawford, Rilda Bulls 01/26/2014, 7:44 PM

## 2014-01-26 NOTE — ED Provider Notes (Signed)
CSN: [id number]     Arrival date & time 01/26/14  1637 History  This chart was scribed for Terri Piedraourtney Forcucci, PA-C working with  Ethelda ChickMartha K Linker, MD by Evon Slackerrance Branch, ED Scribe. This patient was seen in room TR11C/TR11C and the patient's care was started at 6:46 PM.    Chief Complaint  Patient presents with  . Hand Pain   Patient is a 33 y.o. male presenting with hand pain. The history is provided by the patient. No language interpreter was used.  Hand Pain  Hand Pain Associated symptoms include arthralgias. Pertinent negatives include no joint swelling.   HPI Comments: Thomas Ali is a 33 y.o. male who presents to the Emergency Department complaining of burning right hand pain onset 5 days prior. He states he initially hit his forearm on a piece of metal but is feeling most of his pain in his hand. He states that the pain has been progressively getting worse. He states he feels the most pain in his 1st and 2nd digit. Pt states he has some associated numbness in his 1st digit as well. He states his pain severity was 9/10. He states he has been applying icy hot with no relief. He states he has taken aleve with no relief.   Past Medical History  Diagnosis Date  . Migraine    History reviewed. No pertinent past surgical history. No family history on file. History  Substance Use Topics  . Smoking status: Former Games developermoker  . Smokeless tobacco: Not on file  . Alcohol Use: Yes    Review of Systems  Musculoskeletal: Positive for arthralgias. Negative for joint swelling.  All other systems reviewed and are negative.   Allergies  Review of patient's allergies indicates no known allergies.  Home Medications   Prior to Admission medications   Medication Sig Start Date End Date Taking? Authorizing Provider  Aspirin-Salicylamide-Caffeine (BC HEADACHE POWDER PO) Take 1 each by mouth daily as needed (pain).   Yes Historical Provider, MD  gabapentin (NEURONTIN) 300 MG capsule Take 1  pill on day one, take 2 pills on day 2, take 3 pills on day 3 and then continue to take 3 times daily 01/26/14   Makailee Nudelman A Forcucci, PA-C   Triage Vitals: BP 115/82  Pulse 62  Temp(Src) 98.3 F (36.8 C) (Oral)  Resp 15  Wt 135 lb (61.236 kg)  SpO2 100%  Physical Exam  Nursing note and vitals reviewed. Constitutional: He is oriented to person, place, and time. He appears well-developed and well-nourished. No distress.  HENT:  Head: Normocephalic and atraumatic.  Mouth/Throat: Oropharynx is clear and moist. No oropharyngeal exudate.  Eyes: Conjunctivae are normal. No scleral icterus.  Neck: Normal range of motion. Neck supple. No JVD present. No thyromegaly present.  Cardiovascular: Normal rate, regular rhythm, normal heart sounds and intact distal pulses.  Exam reveals no gallop and no friction rub.   No murmur heard. Pulses:      Radial pulses are 2+ on the right side, and 2+ on the left side.  Pulmonary/Chest: Effort normal and breath sounds normal. No respiratory distress. He has no wheezes. He has no rales. He exhibits no tenderness.  Musculoskeletal: Normal range of motion.       Right wrist: Normal. He exhibits normal range of motion, no tenderness, no bony tenderness, no swelling, no effusion, no crepitus, no deformity and no laceration.       Right hand: He exhibits normal range of motion, no tenderness, no bony tenderness,  normal two-point discrimination, normal capillary refill, no deformity, no laceration and no swelling. Normal sensation noted. Decreased sensation is not present in the ulnar distribution, is not present in the medial distribution and is not present in the radial distribution. Normal strength noted. He exhibits no finger abduction, no thumb/finger opposition and no wrist extension trouble.  Lymphadenopathy:    He has no cervical adenopathy.  Neurological: He is alert and oriented to person, place, and time.  Skin: Skin is warm and dry. He is not diaphoretic.   Psychiatric: He has a normal mood and affect. His behavior is normal. Judgment and thought content normal.    ED Course  Procedures (including critical care time) DIAGNOSTIC STUDIES: Oxygen Saturation is 100% on RA, normal by my interpretation.    COORDINATION OF CARE: 7:16 PM-Discussed treatment plan which includes hand splint, pain medication, and orthopedic referral with pt at bedside and pt agreed to plan.     Labs Review Labs Reviewed - No data to display  Imaging Review Dg Forearm Right  01/26/2014   CLINICAL DATA:  Metal object dropped on to right arm. Radial side arm pain.  EXAM: RIGHT FOREARM - 2 VIEW  COMPARISON:  None.  FINDINGS: There is no evidence of fracture or other focal bone lesions. Soft tissues are unremarkable.  IMPRESSION: No significant abnormality identified.   Electronically Signed   By: Herbie Baltimore M.D.   On: 01/26/2014 17:43   Dg Hand Complete Right  01/26/2014   CLINICAL DATA:  Injury.  EXAM: RIGHT HAND - COMPLETE 3+ VIEW  COMPARISON:  None.  FINDINGS: There is no evidence of fracture or dislocation. There is no evidence of arthropathy or other focal bone abnormality. Soft tissues are unremarkable.  IMPRESSION: Negative.   Electronically Signed   By: Elberta Fortis M.D.   On: 01/26/2014 17:39     EKG Interpretation None      MDM   Final diagnoses:  Right wrist pain    Patient is a 33 y.o. Male who presents to the ED with right wrist pain after contusion to the right arm.  Physical exam reveals a neurovascularly intact hand.  Xrays here are negative at this time.  Given history suspect possible nerve contusion.  Will place in a Brace for comfort and have given gabapentin prescription.  Patient has aleve at home and was instructed to take 500 mg BID AC.  Patient to follow-up with ortho.  Patient is stable for discharge at this time.   I personally performed the services described in this documentation, which was scribed in my presence. The  recorded information has been reviewed and is accurate.      Eben Burow, PA-C 01/27/14 [phone]

## 2014-01-26 NOTE — Discharge Instructions (Signed)

## 2014-01-27 NOTE — ED Provider Notes (Signed)
Medical screening examination/treatment/procedure(s) were performed by non-physician practitioner and as supervising physician I was immediately available for consultation/collaboration.   EKG Interpretation None       Martha K Linker, MD 01/27/14 1509 

## 2014-05-05 ENCOUNTER — Emergency Department (HOSPITAL_COMMUNITY)
Admission: EM | Admit: 2014-05-05 | Discharge: 2014-05-06 | Disposition: A | Discharge status: Home / Self Care | Payer: BC Managed Care – PPO | Attending: Emergency Medicine | Admitting: Emergency Medicine

## 2014-05-05 ENCOUNTER — Emergency Department (HOSPITAL_COMMUNITY): Payer: BC Managed Care – PPO

## 2014-05-05 ENCOUNTER — Encounter (HOSPITAL_COMMUNITY): Payer: Self-pay | Admitting: Radiology

## 2014-05-05 DIAGNOSIS — S61412A Laceration without foreign body of left hand, initial encounter: Secondary | ICD-10-CM | POA: Diagnosis not present

## 2014-05-05 DIAGNOSIS — S3121XA Laceration without foreign body of penis, initial encounter: Secondary | ICD-10-CM | POA: Diagnosis not present

## 2014-05-05 DIAGNOSIS — Z7982 Long term (current) use of aspirin: Secondary | ICD-10-CM | POA: Insufficient documentation

## 2014-05-05 DIAGNOSIS — Z87891 Personal history of nicotine dependence: Secondary | ICD-10-CM | POA: Insufficient documentation

## 2014-05-05 DIAGNOSIS — Y9389 Activity, other specified: Secondary | ICD-10-CM | POA: Diagnosis not present

## 2014-05-05 DIAGNOSIS — Z23 Encounter for immunization: Secondary | ICD-10-CM | POA: Diagnosis not present

## 2014-05-05 DIAGNOSIS — G43909 Migraine, unspecified, not intractable, without status migrainosus: Secondary | ICD-10-CM | POA: Insufficient documentation

## 2014-05-05 DIAGNOSIS — Z79899 Other long term (current) drug therapy: Secondary | ICD-10-CM | POA: Insufficient documentation

## 2014-05-05 DIAGNOSIS — R000 Tachycardia, unspecified: Secondary | ICD-10-CM | POA: Diagnosis not present

## 2014-05-05 DIAGNOSIS — Y92410 Unspecified street and highway as the place of occurrence of the external cause: Secondary | ICD-10-CM | POA: Diagnosis not present

## 2014-05-05 DIAGNOSIS — S0181XA Laceration without foreign body of other part of head, initial encounter: Secondary | ICD-10-CM | POA: Insufficient documentation

## 2014-05-05 DIAGNOSIS — Y998 Other external cause status: Secondary | ICD-10-CM | POA: Diagnosis not present

## 2014-05-05 DIAGNOSIS — S0990XA Unspecified injury of head, initial encounter: Secondary | ICD-10-CM | POA: Diagnosis present

## 2014-05-05 LAB — CBC
HCT: 42.9 % (ref 39.0–52.0)
Hemoglobin: 15.1 g/dL (ref 13.0–17.0)
MCH: 32.2 pg (ref 26.0–34.0)
MCHC: 35.2 g/dL (ref 30.0–36.0)
MCV: 91.5 fL (ref 78.0–100.0)
PLATELETS: 226 10*3/uL (ref 150–400)
RBC: 4.69 MIL/uL (ref 4.22–5.81)
RDW: 12.8 % (ref 11.5–15.5)
WBC: 7 10*3/uL (ref 4.0–10.5)

## 2014-05-05 LAB — COMPREHENSIVE METABOLIC PANEL
ALK PHOS: 71 U/L (ref 39–117)
ALT: 240 U/L — ABNORMAL HIGH (ref 0–53)
ANION GAP: 15 (ref 5–15)
AST: 284 U/L — AB (ref 0–37)
Albumin: 4.1 g/dL (ref 3.5–5.2)
BILIRUBIN TOTAL: 0.5 mg/dL (ref 0.3–1.2)
BUN: 8 mg/dL (ref 6–23)
CHLORIDE: 106 meq/L (ref 96–112)
CO2: 22 meq/L (ref 19–32)
CREATININE: 0.85 mg/dL (ref 0.50–1.35)
Calcium: 9.1 mg/dL (ref 8.4–10.5)
GFR calc Af Amer: >90 mL/min (ref >90)
Glucose, Bld: 141 mg/dL — ABNORMAL HIGH (ref 70–99)
POTASSIUM: 3.8 meq/L (ref 3.7–5.3)
Sodium: 143 mEq/L (ref 137–147)
Total Protein: 7.2 g/dL (ref 6.0–8.3)

## 2014-05-05 LAB — SAMPLE TO BLOOD BANK

## 2014-05-05 LAB — PROTIME-INR
INR: 1.07 (ref 0.00–1.49)
Prothrombin Time: 14 seconds (ref 11.6–15.2)

## 2014-05-05 LAB — ETHANOL: Alcohol, Ethyl (B): 279 mg/dL — ABNORMAL HIGH (ref 0–11)

## 2014-05-05 MED ORDER — TETANUS-DIPHTH-ACELL PERTUSSIS 5-2.5-18.5 LF-MCG/0.5 IM SUSP
0.5000 mL | Freq: Once | INTRAMUSCULAR | Status: AC
  Administered 2014-05-06: 0.5 mL via INTRAMUSCULAR
  Filled 2014-05-05: qty 0.5

## 2014-05-05 MED ORDER — IOHEXOL 300 MG/ML  SOLN
100.0000 mL | Freq: Once | INTRAMUSCULAR | Status: AC | PRN
  Administered 2014-05-05: 100 mL via INTRAVENOUS

## 2014-05-05 NOTE — ED Notes (Signed)
Pt placed into gown and on monitor upon arrival to room. Pt monitored by blood pressure, pulse ox, and 12 lead.  

## 2014-05-05 NOTE — Progress Notes (Signed)
Pt requested to call his mother. Phone number listed in emergency contact is disconnected. Pt notified but his follow-up requests don't cohere. Cell phone not on-hand. Pt's makes repeated references to "Lowella Bandyikki" but can give no information concerning her.   Gala RomneyBrown, Abdullahi Vallone J, Chaplain 05/05/2014
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
## 2014-05-06 LAB — URINALYSIS, ROUTINE W REFLEX MICROSCOPIC
Bilirubin Urine: NEGATIVE
GLUCOSE, UA: NEGATIVE mg/dL
Ketones, ur: NEGATIVE mg/dL
LEUKOCYTES UA: NEGATIVE
Nitrite: NEGATIVE
PH: 6.5 (ref 5.0–8.0)
PROTEIN: NEGATIVE mg/dL
Specific Gravity, Urine: <1.005 — ABNORMAL LOW (ref 1.005–1.030)
Urobilinogen, UA: 0.2 mg/dL (ref 0.0–1.0)

## 2014-05-06 LAB — URINE MICROSCOPIC-ADD ON

## 2014-05-06 MED ORDER — ONDANSETRON 4 MG PO TBDP: 4.0000 mg | ORAL_TABLET | Freq: Three times a day (TID) | ORAL | Status: DC | PRN

## 2014-05-06 MED ORDER — LIDOCAINE HCL (PF) 1 % IJ SOLN
30.0000 mL | Freq: Once | INTRAMUSCULAR | Status: AC
  Administered 2014-05-06: 30 mL
  Filled 2014-05-06: qty 30

## 2014-05-06 MED ORDER — OXYCODONE-ACETAMINOPHEN 5-325 MG PO TABS: 2.0000 | ORAL_TABLET | ORAL | Status: DC | PRN

## 2014-05-06 MED ORDER — MORPHINE SULFATE 4 MG/ML IJ SOLN
4.0000 mg | Freq: Once | INTRAMUSCULAR | Status: AC
  Administered 2014-05-06: 4 mg via INTRAVENOUS
  Filled 2014-05-06: qty 1

## 2014-05-06 MED ORDER — OXYCODONE-ACETAMINOPHEN 5-325 MG PO TABS
1.0000 | ORAL_TABLET | Freq: Once | ORAL | Status: AC
  Administered 2014-05-06: 1 via ORAL
  Filled 2014-05-06: qty 1

## 2014-05-06 MED ORDER — NAPROXEN 500 MG PO TABS: 500.0000 mg | ORAL_TABLET | Freq: Two times a day (BID) | ORAL | Status: DC

## 2014-05-06 MED ORDER — SODIUM CHLORIDE 0.9 % IV BOLUS (SEPSIS)
1000.0000 mL | Freq: Once | INTRAVENOUS | Status: AC
  Administered 2014-05-06: 1000 mL via INTRAVENOUS

## 2014-05-06 MED ORDER — MORPHINE SULFATE 4 MG/ML IJ SOLN
4.0000 mg | INTRAMUSCULAR | Status: DC | PRN
  Administered 2014-05-06: 4 mg via INTRAVENOUS
  Filled 2014-05-06: qty 1

## 2014-05-06 MED ORDER — ONDANSETRON HCL 4 MG/2ML IJ SOLN
4.0000 mg | Freq: Once | INTRAMUSCULAR | Status: AC
  Administered 2014-05-06: 4 mg via INTRAVENOUS
  Filled 2014-05-06: qty 2

## 2014-05-06 NOTE — ED Provider Notes (Signed)
10:40 AM:  Patient reevaluated multiple times. Complains of pain and nausea. Medicated. Has abrasion to his left shoulder and right hip area and no bonyabnormalityis noted on exam 4 on radiographs. Family is here. Patient is ambulatory. Taking by mouth. Plan is discharge home. Suture removal in 10 days. Rx: Percocet, naproxen, Zofran.  Rolland PorterMark Klani Caridi, MD 05/06/14 [phone]

## 2014-05-06 NOTE — ED Notes (Signed)
MD at bedside suturing

## 2014-05-06 NOTE — ED Notes (Signed)
Family at beside. Family given emotional support. 
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
## 2014-05-06 NOTE — ED Notes (Signed)
Family at bedside. 

## 2014-05-06 NOTE — ED Notes (Addendum)
Pt given paper scrubs; able to dress himself; drinking apple juice. There were no belongings at bedside.

## 2014-05-06 NOTE — ED Notes (Signed)
This RN walked by room and pt standing up in with urinal in hand and side rails still up. Bed cleaned, and pt placed back in the bed and back on the monitor.

## 2014-05-18 ENCOUNTER — Emergency Department (HOSPITAL_COMMUNITY): Payer: BC Managed Care – PPO

## 2014-05-18 ENCOUNTER — Emergency Department (HOSPITAL_COMMUNITY)
Admission: EM | Admit: 2014-05-18 | Discharge: 2014-05-18 | Disposition: A | Discharge status: Home / Self Care | Payer: BC Managed Care – PPO | Attending: Emergency Medicine | Admitting: Emergency Medicine

## 2014-05-18 ENCOUNTER — Encounter (HOSPITAL_COMMUNITY): Payer: Self-pay | Admitting: Family Medicine

## 2014-05-18 DIAGNOSIS — Z8679 Personal history of other diseases of the circulatory system: Secondary | ICD-10-CM | POA: Insufficient documentation

## 2014-05-18 DIAGNOSIS — Z79899 Other long term (current) drug therapy: Secondary | ICD-10-CM | POA: Insufficient documentation

## 2014-05-18 DIAGNOSIS — R079 Chest pain, unspecified: Secondary | ICD-10-CM | POA: Diagnosis not present

## 2014-05-18 DIAGNOSIS — Z87891 Personal history of nicotine dependence: Secondary | ICD-10-CM | POA: Diagnosis not present

## 2014-05-18 DIAGNOSIS — Z791 Long term (current) use of non-steroidal anti-inflammatories (NSAID): Secondary | ICD-10-CM | POA: Insufficient documentation

## 2014-05-18 DIAGNOSIS — Y998 Other external cause status: Secondary | ICD-10-CM | POA: Insufficient documentation

## 2014-05-18 DIAGNOSIS — Z4802 Encounter for removal of sutures: Secondary | ICD-10-CM | POA: Diagnosis not present

## 2014-05-18 DIAGNOSIS — R52 Pain, unspecified: Secondary | ICD-10-CM

## 2014-05-18 DIAGNOSIS — Y939 Activity, unspecified: Secondary | ICD-10-CM | POA: Diagnosis not present

## 2014-05-18 DIAGNOSIS — S42102A Fracture of unspecified part of scapula, left shoulder, initial encounter for closed fracture: Secondary | ICD-10-CM | POA: Diagnosis not present

## 2014-05-18 DIAGNOSIS — X58XXXA Exposure to other specified factors, initial encounter: Secondary | ICD-10-CM | POA: Insufficient documentation

## 2014-05-18 DIAGNOSIS — Y929 Unspecified place or not applicable: Secondary | ICD-10-CM | POA: Diagnosis not present

## 2014-05-18 MED ORDER — HYDROCODONE-ACETAMINOPHEN 5-325 MG PO TABS: 1.0000 | ORAL_TABLET | Freq: Four times a day (QID) | ORAL | Status: DC | PRN

## 2014-05-18 MED ORDER — HYDROCODONE-ACETAMINOPHEN 5-325 MG PO TABS
1.0000 | ORAL_TABLET | Freq: Once | ORAL | Status: AC
  Administered 2014-05-18: 1 via ORAL
  Filled 2014-05-18: qty 1

## 2014-05-18 MED ORDER — NAPROXEN 500 MG PO TABS: 500.0000 mg | ORAL_TABLET | Freq: Two times a day (BID) | ORAL | Status: DC

## 2014-05-18 NOTE — ED Notes (Signed)
Pt transported to XR.  

## 2014-05-18 NOTE — ED Notes (Addendum)
Pt here for suture removal and sts there is a wound to right hip area that will not heal. sts also left shoulder, back, neck and hip pain where the wound is.,
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
## 2014-05-18 NOTE — ED Notes (Signed)
PA at bedside for suture removal
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
## 2014-05-18 NOTE — Discharge Instructions (Signed)
Ice. Sling at all times. Naprosyn for pain. Norco for severe pain. Follow up with orthopedics specialist as referred. Also make sure to follow up with primary care doctor or urgent care for repeat chest xray in 1 week.   Scapular Fracture You have a fracture (break in bone) of your scapula. This is your shoulder blade. It is the large flat bone behind your shoulder. This is also the bone that makes up the ball and socket joint of your shoulder. Most of the time surgery is not required for injuries to this bone unless the socket of the shoulder joint is involved. DIAGNOSIS  Because of the severity of force usually required to break this bone, x-rays are often taken of other bones likely to be injured at the same time. X-rays of the hip, knee, and pelvis may be taken. Specialized x-rays (arteriograms) may be needed if there are injuries to large blood vessels associated with this injury. HOME CARE INSTRUCTIONS   Simple fractures of the scapula can be treated with a sling and swathe type of immobilization. This means the involved area is held in place by putting the arm in a sling. A wrap is made around the upper arm with the sling holding the arm next to the chest. This may be removed for bathing as instructed by your caregiver.  Apply ice to the injury for 15-20 minutes 03-04 times per day. Put the ice in a plastic bag. Place a towel between the bag of ice and your skin, splint, or immobilization device.  Do not resume use until instructed by your caregiver. Usually full rehabilitation (exercises to improve the injury site) will begin sometime after the sling and swathe are removed. Then begin use gradually as directed. Do not increase use to the point of pain. If pain develops, decrease use and continue the above measures. Slowly increase activities that do not cause discomfort until you gradually achieve normal use without pain.  Only take over-the-counter or prescription medicines for pain,  discomfort, or fever as directed by your caregiver. SEEK IMMEDIATE MEDICAL CARE IF:   Your pain and swelling increase and is uncontrolled with medications.  You develop new, unexplained symptoms or an increase of the symptoms which brought you to your caregiver.  You develop shortness or breath or cough up blood.  You are unable to move your arm or fingers. You develop warmth and swelling in your affected arm.  You develop an unexplained temperature. Document Released: 06/05/2005 Document Revised: 08/28/2011 Document Reviewed: 04/27/2006 [facility] Patient Information 2015 [place]. This information is not intended to replace advice given to you by your health care provider. Make sure you discuss any questions you have with your health care provider.

## 2015-01-22 ENCOUNTER — Emergency Department (HOSPITAL_COMMUNITY): Payer: BLUE CROSS/BLUE SHIELD

## 2015-01-22 ENCOUNTER — Emergency Department (HOSPITAL_COMMUNITY)
Admission: EM | Admit: 2015-01-22 | Discharge: 2015-01-22 | Disposition: A | Discharge status: Home / Self Care | Payer: BLUE CROSS/BLUE SHIELD | Attending: Emergency Medicine | Admitting: Emergency Medicine

## 2015-01-22 ENCOUNTER — Encounter (HOSPITAL_COMMUNITY): Payer: Self-pay

## 2015-01-22 DIAGNOSIS — R0602 Shortness of breath: Secondary | ICD-10-CM | POA: Insufficient documentation

## 2015-01-22 DIAGNOSIS — R079 Chest pain, unspecified: Secondary | ICD-10-CM | POA: Diagnosis not present

## 2015-01-22 DIAGNOSIS — R05 Cough: Secondary | ICD-10-CM | POA: Insufficient documentation

## 2015-01-22 DIAGNOSIS — Z720 Tobacco use: Secondary | ICD-10-CM | POA: Insufficient documentation

## 2015-01-22 DIAGNOSIS — Z8669 Personal history of other diseases of the nervous system and sense organs: Secondary | ICD-10-CM | POA: Diagnosis not present

## 2015-01-22 DIAGNOSIS — Z791 Long term (current) use of non-steroidal anti-inflammatories (NSAID): Secondary | ICD-10-CM | POA: Diagnosis not present
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
## 2015-01-22 NOTE — Discharge Instructions (Signed)

## 2015-08-12 ENCOUNTER — Emergency Department (HOSPITAL_COMMUNITY)
Admission: EM | Admit: 2015-08-12 | Discharge: 2015-08-12 | Disposition: A | Discharge status: Home / Self Care | Payer: BLUE CROSS/BLUE SHIELD | Attending: Emergency Medicine | Admitting: Emergency Medicine

## 2015-08-12 ENCOUNTER — Encounter (HOSPITAL_COMMUNITY): Payer: Self-pay | Admitting: *Deleted

## 2015-08-12 DIAGNOSIS — F17200 Nicotine dependence, unspecified, uncomplicated: Secondary | ICD-10-CM | POA: Insufficient documentation

## 2015-08-12 DIAGNOSIS — N6082 Other benign mammary dysplasias of left breast: Secondary | ICD-10-CM | POA: Diagnosis not present

## 2015-08-12 DIAGNOSIS — N6489 Other specified disorders of breast: Secondary | ICD-10-CM | POA: Diagnosis present

## 2015-08-12 DIAGNOSIS — Z791 Long term (current) use of non-steroidal anti-inflammatories (NSAID): Secondary | ICD-10-CM | POA: Diagnosis not present

## 2015-08-12 DIAGNOSIS — Z8679 Personal history of other diseases of the circulatory system: Secondary | ICD-10-CM | POA: Diagnosis not present

## 2015-08-12 MED ORDER — LIDOCAINE HCL (PF) 1 % IJ SOLN
5.0000 mL | Freq: Once | INTRAMUSCULAR | Status: AC
  Administered 2015-08-12: 5 mL via INTRADERMAL
  Filled 2015-08-12: qty 5

## 2015-08-12 MED ORDER — BACITRACIN ZINC 500 UNIT/GM EX OINT: 1.0000 "application " | TOPICAL_OINTMENT | Freq: Two times a day (BID) | CUTANEOUS | Status: DC

## 2015-08-12 NOTE — ED Notes (Signed)
Pt with drainage and pain immediately to L of L nipple.  States hx of same.

## 2015-08-12 NOTE — Discharge Instructions (Signed)
Epidermal Cyst An epidermal cyst is sometimes called a sebaceous cyst, epidermal inclusion cyst, or infundibular cyst. These cysts usually contain a substance that looks "pasty" or "cheesy" and may have a bad smell. This substance is a protein called keratin. Epidermal cysts are usually found on the face, neck, or trunk. They may also occur in the vaginal area or other parts of the genitalia of both men and women. Epidermal cysts are usually small, painless, slow-growing bumps or lumps that move freely under the skin. It is important not to try to pop them. This may cause an infection and lead to tenderness and swelling. CAUSES  Epidermal cysts may be caused by a deep penetrating injury to the skin or a plugged hair follicle, often associated with acne. SYMPTOMS  Epidermal cysts can become inflamed and cause:  Redness.  Tenderness.  Increased temperature of the skin over the bumps or lumps.  Grayish-white, bad smelling material that drains from the bump or lump. DIAGNOSIS  Epidermal cysts are easily diagnosed by your caregiver during an exam. Rarely, a tissue sample (biopsy) may be taken to rule out other conditions that may resemble epidermal cysts. TREATMENT   Epidermal cysts often get better and disappear on their own. They are rarely ever cancerous.  If a cyst becomes infected, it may become inflamed and tender. This may require opening and draining the cyst. Treatment with antibiotics may be necessary. When the infection is gone, the cyst may be removed with minor surgery.  Small, inflamed cysts can often be treated with antibiotics or by injecting steroid medicines.  Sometimes, epidermal cysts become large and bothersome. If this happens, surgical removal in your caregiver's office may be necessary. HOME CARE INSTRUCTIONS  Only take over-the-counter or prescription medicines as directed by your caregiver.  Take your antibiotics as directed. Finish them even if you start to feel  better. SEEK MEDICAL CARE IF:   Your cyst becomes tender, red, or swollen.  Your condition is not improving or is getting worse.  You have any other questions or concerns. MAKE SURE YOU:  Understand these instructions.  Will watch your condition.  Will get help right away if you are not doing well or get worse.   This information is not intended to replace advice given to you by your health care provider. Make sure you discuss any questions you have with your health care provider.   Document Released: 05/06/2004 Document Revised: 08/28/2011 Document Reviewed: 12/12/2010 Elsevier Interactive Patient Education 2016 [employer] Inc[employer]   Sebaceous Cyst Removal, Care After Refer to this sheet in the next few weeks. These instructions provide you with information about caring for yourself after your procedure. Your health care provider may also give you more specific instructions. Your treatment has been planned according to current medical practices, but problems sometimes occur. Call your health care provider if you have any problems or questions after your procedure. WHAT TO EXPECT AFTER THE PROCEDURE After your procedure, it is common to have:  Soreness in the area where your cyst was removed.  Tightness or itching from your skin sutures. HOME CARE INSTRUCTIONS  Take medicines only as directed by your health care provider.  If you were prescribed an antibiotic medicine, finish all of it even if you start to feel better.  Use antibiotic ointment as directed by your health care provider. Follow the instructions carefully.  There are many different ways to close and cover an incision, including stitches (sutures), skin glue, and adhesive strips. Follow your health  care provider's instructions about:  Incision care.  Bandage (dressing) changes and removal.  Incision closure removal.  Keep the bandage (dressing) dry until your health care provider says that it can be removed. Take  sponge baths only. Ask your health care provider when you can start showering or taking a bath.  After your dressing is off, check your incision every day for signs of infection. Watch for:  Redness, swelling, or pain.  Fluid, blood, or pus.  You can return to your normal activities. Do not do anything that stretches or puts pressure on your incision.  You can return to your normal diet.  Keep all follow-up visits as directed by your health care provider. This is important. SEEK MEDICAL CARE IF:  You have a fever.  Your incision bleeds.  You have redness, swelling, or pain in the incision area.  You have fluid, blood, or pus coming from your incision.  Your cyst comes back after surgery.   This information is not intended to replace advice given to you by your health care provider. Make sure you discuss any questions you have with your health care provider.   Document Released: 06/26/2014 Document Reviewed: 06/26/2014 Elsevier Interactive Patient Education 2016 [employer]  Incision and Drainage, Care After Refer to this sheet in the next few weeks. These instructions provide you with information on caring for yourself after your procedure. Your caregiver may also give you more specific instructions. Your treatment has been planned according to current medical practices, but problems sometimes occur. Call your caregiver if you have any problems or questions after your procedure. HOME CARE INSTRUCTIONS   If antibiotic medicine is given, take it as directed. Finish it even if you start to feel better.  Only take over-the-counter or prescription medicines for pain, discomfort, or fever as directed by your caregiver.  Keep all follow-up appointments as directed by your caregiver.  Change any bandages (dressings) as directed by your caregiver. Replace old dressings with clean dressings.  Wash your hands before and after caring for your wound. You will receive specific  instructions for cleansing and caring for your wound.  SEEK MEDICAL CARE IF:   You have increased pain, swelling, or redness around the wound.  You have increased drainage, smell, or bleeding from the wound.  You have muscle aches, chills, or you feel generally sick.  You have a fever. MAKE SURE YOU:   Understand these instructions.  Will watch your condition.  Will get help right away if you are not doing well or get worse.   This information is not intended to replace advice given to you by your health care provider. Make sure you discuss any questions you have with your health care provider.   Document Released: 08/28/2011 Document Revised: 06/26/2014 Document Reviewed: 08/28/2011 Elsevier Interactive Patient Education [employer]
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
## 2015-08-12 NOTE — ED Notes (Signed)
I and D tray set up at bedside .  

## 2015-08-12 NOTE — ED Notes (Signed)
Bacitracin and gauze dressing applied to abscess area.

## 2015-10-10 ENCOUNTER — Emergency Department (HOSPITAL_COMMUNITY): Payer: BLUE CROSS/BLUE SHIELD

## 2015-10-10 ENCOUNTER — Emergency Department (HOSPITAL_COMMUNITY)
Admission: EM | Admit: 2015-10-10 | Discharge: 2015-10-10 | Disposition: A | Discharge status: Home / Self Care | Payer: BLUE CROSS/BLUE SHIELD | Attending: Emergency Medicine | Admitting: Emergency Medicine

## 2015-10-10 DIAGNOSIS — S21112A Laceration without foreign body of left front wall of thorax without penetration into thoracic cavity, initial encounter: Secondary | ICD-10-CM | POA: Insufficient documentation

## 2015-10-10 DIAGNOSIS — Y939 Activity, unspecified: Secondary | ICD-10-CM | POA: Diagnosis not present

## 2015-10-10 DIAGNOSIS — Z791 Long term (current) use of non-steroidal anti-inflammatories (NSAID): Secondary | ICD-10-CM | POA: Insufficient documentation

## 2015-10-10 DIAGNOSIS — Y929 Unspecified place or not applicable: Secondary | ICD-10-CM | POA: Insufficient documentation

## 2015-10-10 DIAGNOSIS — S51012A Laceration without foreign body of left elbow, initial encounter: Secondary | ICD-10-CM

## 2015-10-10 DIAGNOSIS — Z23 Encounter for immunization: Secondary | ICD-10-CM | POA: Diagnosis not present

## 2015-10-10 DIAGNOSIS — Y999 Unspecified external cause status: Secondary | ICD-10-CM | POA: Insufficient documentation

## 2015-10-10 DIAGNOSIS — F17200 Nicotine dependence, unspecified, uncomplicated: Secondary | ICD-10-CM | POA: Diagnosis not present

## 2015-10-10 LAB — CBC WITH DIFFERENTIAL/PLATELET
BASOS ABS: 0.1 10*3/uL (ref 0.0–0.1)
BASOS PCT: 1 %
EOS ABS: 0.2 10*3/uL (ref 0.0–0.7)
Eosinophils Relative: 2 %
HCT: 42.4 % (ref 39.0–52.0)
HEMOGLOBIN: 14.9 g/dL (ref 13.0–17.0)
LYMPHS ABS: 2.2 10*3/uL (ref 0.7–4.0)
Lymphocytes Relative: 29 %
MCH: 32.7 pg (ref 26.0–34.0)
MCHC: 35.1 g/dL (ref 30.0–36.0)
MCV: 93 fL (ref 78.0–100.0)
Monocytes Absolute: 0.6 10*3/uL (ref 0.1–1.0)
Monocytes Relative: 7 %
NEUTROS PCT: 61 %
Neutro Abs: 4.6 10*3/uL (ref 1.7–7.7)
PLATELETS: 239 10*3/uL (ref 150–400)
RBC: 4.56 MIL/uL (ref 4.22–5.81)
RDW: 12.7 % (ref 11.5–15.5)
WBC: 7.7 10*3/uL (ref 4.0–10.5)

## 2015-10-10 LAB — BASIC METABOLIC PANEL
Anion gap: 13 (ref 5–15)
BUN: 14 mg/dL (ref 6–20)
CALCIUM: 9 mg/dL (ref 8.9–10.3)
CHLORIDE: 111 mmol/L (ref 101–111)
CO2: 22 mmol/L (ref 22–32)
CREATININE: 0.94 mg/dL (ref 0.61–1.24)
Glucose, Bld: 93 mg/dL (ref 65–99)
Potassium: 3.9 mmol/L (ref 3.5–5.1)
SODIUM: 146 mmol/L — AB (ref 135–145)

## 2015-10-10 MED ORDER — HYDROCODONE-ACETAMINOPHEN 5-325 MG PO TABS: 1.0000 | ORAL_TABLET | Freq: Four times a day (QID) | ORAL | Status: DC | PRN

## 2015-10-10 MED ORDER — IOPAMIDOL (ISOVUE-300) INJECTION 61%
100.0000 mL | Freq: Once | INTRAVENOUS | Status: AC | PRN
  Administered 2015-10-10: 100 mL via INTRAVENOUS

## 2015-10-10 MED ORDER — TETANUS-DIPHTH-ACELL PERTUSSIS 5-2.5-18.5 LF-MCG/0.5 IM SUSP
0.5000 mL | Freq: Once | INTRAMUSCULAR | Status: AC
  Administered 2015-10-10: 0.5 mL via INTRAMUSCULAR
  Filled 2015-10-10: qty 0.5

## 2015-10-10 MED ORDER — LIDOCAINE HCL 1 % IJ SOLN
INTRAMUSCULAR | Status: AC
  Filled 2015-10-10: qty 20

## 2015-10-10 MED ORDER — CEPHALEXIN 500 MG PO CAPS: 500.0000 mg | ORAL_CAPSULE | Freq: Four times a day (QID) | ORAL | Status: DC

## 2015-10-10 NOTE — Discharge Instructions (Signed)
Local wound care with bacitracin and dressing changes twice daily.  Return to the emergency department if you develop redness around the wound, pus straining from the wound, increasing pain, or other new and concerning symptoms.  Keflex as prescribed.  Hydrocodone as prescribed as needed for pain.   Laceration Care, Adult A laceration is a cut that goes through all of the layers of the skin and into the tissue that is right under the skin. Some lacerations heal on their own. Others need to be closed with stitches (sutures), staples, skin adhesive strips, or skin glue. Proper laceration care minimizes the risk of infection and helps the laceration to heal better. HOW TO CARE FOR YOUR LACERATION If sutures or staples were used:  Keep the wound clean and dry.  If you were given a bandage (dressing), you should change it at least one time per day or as told by your health care provider. You should also change it if it becomes wet or dirty.  Keep the wound completely dry for the first 24 hours or as told by your health care provider. After that time, you may shower or bathe. However, make sure that the wound is not soaked in water until after the sutures or staples have been removed.  Clean the wound one time each day or as told by your health care provider:  Wash the wound with soap and water.  Rinse the wound with water to remove all soap.  Pat the wound dry with a clean towel. Do not rub the wound.  After cleaning the wound, apply a thin layer of antibiotic ointmentas told by your health care provider. This will help to prevent infection and keep the dressing from sticking to the wound.  Have the sutures or staples removed as told by your health care provider. If skin adhesive strips were used:  Keep the wound clean and dry.  If you were given a bandage (dressing), you should change it at least one time per day or as told by your health care provider. You should also change it if it  becomes dirty or wet.  Do not get the skin adhesive strips wet. You may shower or bathe, but be careful to keep the wound dry.  If the wound gets wet, pat it dry with a clean towel. Do not rub the wound.  Skin adhesive strips fall off on their own. You may trim the strips as the wound heals. Do not remove skin adhesive strips that are still stuck to the wound. They will fall off in time. If skin glue was used:  Try to keep the wound dry, but you may briefly wet it in the shower or bath. Do not soak the wound in water, such as by swimming.  After you have showered or bathed, gently pat the wound dry with a clean towel. Do not rub the wound.  Do not do any activities that will make you sweat heavily until the skin glue has fallen off on its own.  Do not apply liquid, cream, or ointment medicine to the wound while the skin glue is in place. Using those may loosen the film before the wound has healed.  If you were given a bandage (dressing), you should change it at least one time per day or as told by your health care provider. You should also change it if it becomes dirty or wet.  If a dressing is placed over the wound, be careful not to apply tape directly  over the skin glue. Doing that may cause the glue to be pulled off before the wound has healed.  Do not pick at the glue. The skin glue usually remains in place for 5-10 days, then it falls off of the skin. General Instructions  Take over-the-counter and prescription medicines only as told by your health care provider.  If you were prescribed an antibiotic medicine or ointment, take or apply it as told by your doctor. Do not stop using it even if your condition improves.  To help prevent scarring, make sure to cover your wound with sunscreen whenever you are outside after stitches are removed, after adhesive strips are removed, or when glue remains in place and the wound is healed. Make sure to wear a sunscreen of at least 30 SPF.  Do  not scratch or pick at the wound.  Keep all follow-up visits as told by your health care provider. This is important.  Check your wound every day for signs of infection. Watch for:  Redness, swelling, or pain.  Fluid, blood, or pus.  Raise (elevate) the injured area above the level of your heart while you are sitting or lying down, if possible. SEEK MEDICAL CARE IF:  You received a tetanus shot and you have swelling, severe pain, redness, or bleeding at the injection site.  You have a fever.  A wound that was closed breaks open.  You notice a bad smell coming from your wound or your dressing.  You notice something coming out of the wound, such as wood or glass.  Your pain is not controlled with medicine.  You have increased redness, swelling, or pain at the site of your wound.  You have fluid, blood, or pus coming from your wound.  You notice a change in the color of your skin near your wound.  You need to change the dressing frequently due to fluid, blood, or pus draining from the wound.  You develop a new rash.  You develop numbness around the wound. SEEK IMMEDIATE MEDICAL CARE IF:  You develop severe swelling around the wound.  Your pain suddenly increases and is severe.  You develop painful lumps near the wound or on skin that is anywhere on your body.  You have a red streak going away from your wound.  The wound is on your hand or foot and you cannot properly move a finger or toe.  The wound is on your hand or foot and you notice that your fingers or toes look pale or bluish.   This information is not intended to replace advice given to you by your health care provider. Make sure you discuss any questions you have with your health care provider.   Document Released: 06/05/2005 Document Revised: 10/20/2014 Document Reviewed: 06/01/2014 Elsevier Interactive Patient Education [employer] Inc[employer]
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
## 2015-10-10 NOTE — ED Notes (Signed)
I have dressed his left elbow and chest wounds. He dresses himself in paper scrubs which we provide and he ambulates with our [employer] to our lobby where he is met by his significant other "Thomas Ali", who is driving him home.
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
## 2015-10-12 ENCOUNTER — Encounter (HOSPITAL_COMMUNITY): Payer: Self-pay

## 2015-10-12 ENCOUNTER — Emergency Department (HOSPITAL_COMMUNITY)
Admission: EM | Admit: 2015-10-12 | Discharge: 2015-10-12 | Disposition: A | Discharge status: Home / Self Care | Payer: BLUE CROSS/BLUE SHIELD | Attending: Emergency Medicine | Admitting: Emergency Medicine

## 2015-10-12 DIAGNOSIS — F17200 Nicotine dependence, unspecified, uncomplicated: Secondary | ICD-10-CM | POA: Diagnosis not present

## 2015-10-12 DIAGNOSIS — Z792 Long term (current) use of antibiotics: Secondary | ICD-10-CM | POA: Diagnosis not present

## 2015-10-12 DIAGNOSIS — Z4801 Encounter for change or removal of surgical wound dressing: Secondary | ICD-10-CM | POA: Insufficient documentation

## 2015-10-12 DIAGNOSIS — M25522 Pain in left elbow: Secondary | ICD-10-CM | POA: Diagnosis not present

## 2015-10-12 DIAGNOSIS — Z8679 Personal history of other diseases of the circulatory system: Secondary | ICD-10-CM | POA: Insufficient documentation

## 2015-10-12 DIAGNOSIS — IMO0002 Reserved for concepts with insufficient information to code with codable children: Secondary | ICD-10-CM

## 2015-10-12 NOTE — Discharge Instructions (Signed)
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
## 2015-10-12 NOTE — ED Notes (Signed)
Patient was alert, oriented and stable upon discharge. RN went over AVS and patient had no further questions.  

## 2015-10-12 NOTE — ED Provider Notes (Signed)
CSN: [id number]     Arrival date & time 10/12/15  1659 History  By signing my name below, I, Thomas Ali, attest that this documentation has been prepared under the direction and in the presence of Will Presli Fanguy, PA-C. Electronically Signed: Angelene Giovanni, ED Scribe. 10/12/2015. 7:57 PM.      Chief Complaint  Patient presents with  . Wound Check   The history is provided by the patient. No language interpreter was used.   HPI Comments: Thomas Ali is a 35 y.o. male who presents to the Emergency Department requesting a wound check for wounds to his left elbow and left chest after a laceration repair on 10/10/15. Pt states that he is currently on oral antibiotics. (Keflex)  He reports associated gradually worsening moderate pain to the area above the wound on the left elbow onset yesterday. It feels it has been pulling on the wound. He denies pain to his elbow joint with movement. No discharge. No fevers. He states that he only changed the dressing on the wound on his chest but not the dressing on his left elbow. No other alleviating factors noted. Pt denies any fever, chills, CP, SOB, or rash.    Past Medical History  Diagnosis Date  . Migraine    History reviewed. No pertinent past surgical history. History reviewed. No pertinent family history. Social History  Substance Use Topics  . Smoking status: Current Every Day Smoker -- 0.50 packs/day  . Smokeless tobacco: None  . Alcohol Use: Yes     Comment: Socially     Review of Systems  Constitutional: Negative for fever and chills.  Respiratory: Negative for cough and shortness of breath.   Cardiovascular: Negative for chest pain and palpitations.  Musculoskeletal: Positive for arthralgias (left elbow). Negative for myalgias.  Skin: Positive for wound. Negative for rash.  Neurological: Negative for weakness and numbness.      Allergies  Review of patient's allergies indicates no known allergies.  Home Medications    Prior to Admission medications   Medication Sig Start Date End Date Taking? Authorizing Provider  cephALEXin (KEFLEX) 500 MG capsule Take 1 capsule (500 mg total) by mouth 4 (four) times daily. 10/10/15   Geoffery Lyons, MD  HYDROcodone-acetaminophen (NORCO) 5-325 MG tablet Take 1-2 tablets by mouth every 6 (six) hours as needed. 10/10/15   Geoffery Lyons, MD  Multiple Vitamin (MULTIVITAMIN WITH MINERALS) TABS tablet Take 1 tablet by mouth 2 (two) times daily. Centrum Multivitamin    Historical Provider, MD   BP 110/73 mmHg  Pulse 67  Temp(Src) 98.9 F (37.2 C) (Oral)  Resp 16  SpO2 100% Physical Exam  Constitutional: He appears well-developed and well-nourished. No distress.  Nontoxic appearing.  HENT:  Head: Normocephalic and atraumatic.  Right Ear: External ear normal.  Left Ear: External ear normal.  Eyes: Right eye exhibits no discharge. Left eye exhibits no discharge.  Cardiovascular: Normal rate, regular rhythm and intact distal pulses.   Bilateral radial pulses are intact. Good capillary refill to his distal fingertips bilaterally.  Pulmonary/Chest: Effort normal and breath sounds normal. No respiratory distress.  Healing laceration to his chest wall. No signs of infection and no discharge. No surrounding or overlying erythema.   Abdominal: Soft. There is no tenderness.  Musculoskeletal: Normal range of motion. He exhibits tenderness.  Patient has good ROM of his left shoulder. There is some tenderness to and just above his laceration repair. The wound site is healing well without signs of infection. No overlying  or surrounding erythema. No discharge. Bilateral radial pulses intact.  Good sensations, good cap refills.   Neurological: He is alert. Coordination normal.  Patient has good sensation to his bilateral upper extremities.  Skin: Skin is warm and dry. No rash noted. He is not diaphoretic. No erythema. No pallor.  Psychiatric: He has a normal mood and affect. His behavior  is normal.  Nursing note and vitals reviewed.   ED Course  Procedures (including critical care time) DIAGNOSTIC STUDIES: Oxygen Saturation is 100% on RA, normal by my interpretation.    COORDINATION OF CARE: 7:28 PM- Pt advised of plan for treatment and pt agrees.    Labs Review Labs Reviewed - No data to display  Imaging Review No results found.   Will Lively Haberman, PA-C has personally reviewed and evaluated these images and lab results as part of his medical decision-making.   EKG Interpretation None      Filed Vitals:   10/12/15 1724  BP: 110/73  Pulse: 67  Temp: 98.9 F (37.2 C)  TempSrc: Oral  Resp: 16  SpO2: 100%     MDM   Meds given in ED:  Medications - No data to display  Discharge Medication List as of 10/12/2015  7:38 PM      Final diagnoses:  Encounter for re-check of laceration wound   This  is a 35 y.o. male who presents to the Emergency Department requesting a wound check for wounds to his left elbow and left chest after a laceration repair on 10/10/15. Pt states that he is currently on oral antibiotics. (Keflex)  He reports associated gradually worsening moderate pain to the area above the wound on the left elbow onset yesterday. It feels it has been pulling on the wound. He denies pain to his elbow joint with movement. No discharge. No fevers. No chest pain or shortness of breath. He has no complaints about his chest wound and has been changing the dressing on a regular basis.  On exam the patient is afebrile and non-toxic appearing. His left elbow wound is healing well without signs of infection. No overlying or surrounding erythema. He has good ROM of his left elbow without complaint of pain to his elbow joint. I suspect the patient's pain is coming from the pulling of his laceration repair. I encouraged him to change his dressings at least twice a day and to watch for signs of infection. I encouraged him to avoid heavy lifting until sutures are removed. I  advised to continue the keflex he was prescribed. I discussed return precautions. I advised the patient to follow-up with their primary care provider this week. I advised the patient to return to the emergency department with new or worsening symptoms or new concerns. The patient verbalized understanding and agreement with plan.    This patient was discussed with and evaluated by Dr. Silverio LayYao who agrees with assessment and plan.   I personally performed the services described in this documentation, which was scribed in my presence. The recorded information has been reviewed and is accurate.     Everlene FarrierWilliam Kennie Karapetian, PA-C 10/12/15 2001  Richardean Canalavid H Yao, MD 10/14/15 [phone]

## 2015-10-17 ENCOUNTER — Emergency Department (HOSPITAL_COMMUNITY): Payer: BLUE CROSS/BLUE SHIELD

## 2015-10-17 ENCOUNTER — Encounter (HOSPITAL_COMMUNITY): Payer: Self-pay | Admitting: Emergency Medicine

## 2015-10-17 ENCOUNTER — Emergency Department (HOSPITAL_COMMUNITY)
Admission: EM | Admit: 2015-10-17 | Discharge: 2015-10-17 | Disposition: A | Discharge status: Home / Self Care | Payer: BLUE CROSS/BLUE SHIELD | Attending: Emergency Medicine | Admitting: Emergency Medicine

## 2015-10-17 DIAGNOSIS — R079 Chest pain, unspecified: Secondary | ICD-10-CM | POA: Insufficient documentation

## 2015-10-17 DIAGNOSIS — R059 Cough, unspecified: Secondary | ICD-10-CM

## 2015-10-17 DIAGNOSIS — F17200 Nicotine dependence, unspecified, uncomplicated: Secondary | ICD-10-CM | POA: Diagnosis not present

## 2015-10-17 DIAGNOSIS — R05 Cough: Secondary | ICD-10-CM | POA: Insufficient documentation

## 2015-10-17 DIAGNOSIS — Z5189 Encounter for other specified aftercare: Secondary | ICD-10-CM

## 2015-10-17 DIAGNOSIS — Z79899 Other long term (current) drug therapy: Secondary | ICD-10-CM | POA: Insufficient documentation

## 2015-10-17 DIAGNOSIS — Z79891 Long term (current) use of opiate analgesic: Secondary | ICD-10-CM | POA: Diagnosis not present

## 2015-10-17 DIAGNOSIS — K0889 Other specified disorders of teeth and supporting structures: Secondary | ICD-10-CM | POA: Insufficient documentation

## 2015-10-17 LAB — COMPREHENSIVE METABOLIC PANEL
ALBUMIN: 4.4 g/dL (ref 3.5–5.0)
ALT: 15 U/L — ABNORMAL LOW (ref 17–63)
ANION GAP: 13 (ref 5–15)
AST: 20 U/L (ref 15–41)
Alkaline Phosphatase: 66 U/L (ref 38–126)
BILIRUBIN TOTAL: 0.6 mg/dL (ref 0.3–1.2)
BUN: 11 mg/dL (ref 6–20)
CO2: 21 mmol/L — ABNORMAL LOW (ref 22–32)
Calcium: 9.2 mg/dL (ref 8.9–10.3)
Chloride: 103 mmol/L (ref 101–111)
Creatinine, Ser: 0.92 mg/dL (ref 0.61–1.24)
GFR calc Af Amer: >60 mL/min (ref >60)
GLUCOSE: 99 mg/dL (ref 65–99)
POTASSIUM: 3.5 mmol/L (ref 3.5–5.1)
Sodium: 137 mmol/L (ref 135–145)
TOTAL PROTEIN: 7.2 g/dL (ref 6.5–8.1)

## 2015-10-17 LAB — LIPASE, BLOOD: LIPASE: 25 U/L (ref 11–51)

## 2015-10-17 LAB — CBC
HEMATOCRIT: 36.9 % — AB (ref 39.0–52.0)
HEMOGLOBIN: 13.1 g/dL (ref 13.0–17.0)
MCH: 32.6 pg (ref 26.0–34.0)
MCHC: 35.5 g/dL (ref 30.0–36.0)
MCV: 91.8 fL (ref 78.0–100.0)
Platelets: 279 10*3/uL (ref 150–400)
RBC: 4.02 MIL/uL — ABNORMAL LOW (ref 4.22–5.81)
RDW: 12.7 % (ref 11.5–15.5)
WBC: 8.5 10*3/uL (ref 4.0–10.5)

## 2015-10-17 MED ORDER — HYDROCODONE-ACETAMINOPHEN 5-325 MG PO TABS
2.0000 | ORAL_TABLET | Freq: Once | ORAL | Status: AC
  Administered 2015-10-17: 2 via ORAL
  Filled 2015-10-17: qty 2

## 2015-10-17 MED ORDER — CLINDAMYCIN HCL 150 MG PO CAPS: 300.0000 mg | ORAL_CAPSULE | Freq: Three times a day (TID) | ORAL | Status: DC

## 2015-10-17 MED ORDER — TRAMADOL HCL 50 MG PO TABS: 50.0000 mg | ORAL_TABLET | Freq: Four times a day (QID) | ORAL | Status: DC | PRN

## 2015-10-17 MED ORDER — ONDANSETRON HCL 4 MG PO TABS: 4.0000 mg | ORAL_TABLET | Freq: Four times a day (QID) | ORAL | Status: DC

## 2015-10-17 NOTE — Discharge Instructions (Signed)
Wound Check If you have a wound, it may take some time to heal. Eventually, a scar will form. The scar will also fade with time. It is important to take care of your wound while it is healing. This helps to protect your wound from infection.  HOW SHOULD I TAKE CARE OF MY WOUND AT HOME?  Some wounds are allowed to close on their own or are repaired at a later date. There are many different ways to close and cover a wound, including stitches (sutures), skin glue, and adhesive strips. Follow your health care provider's instructions about:  Wound care.  Bandage (dressing) changes and removal.  Wound closure removal.  Take medicines only as directed by your health care provider.  Keep all follow-up visits as directed by your health care provider. This is important.  Do not take baths, swim, or use a hot tub until your health care provider approves. You may shower as directed by your health care provider.  Keep your wound clean and dry. WHAT AFFECTS SCAR FORMATION? Scars affect each person differently. How your body scars depends on:  The location and size of your wound.  Traits that you inherited from your parents (genetic predisposition).  How you take care of your wound. Irritation and inflammation increase the amount of scar formation.  Sun exposure. This can darken a scar. WHEN SHOULD I CALL OR SEE MY HEALTH CARE PROVIDER? Call or see your health care provider if:  You have redness, swelling, or pain at your wound site.  You have fluid, blood, or pus coming from your wound.  You have muscle aches, chills, or a general ill feeling.  You notice a bad smell coming from the wound.  Your wound separates after the sutures, staples, or skin adhesive strips have been removed.  You have persistent nausea or vomiting.  You have a fever.  You are dizzy. WHEN SHOULD I CALL 911 OR GO TO THE EMERGENCY ROOM? Call 911 or go to the emergency room if:  You faint.  You have difficulty  breathing.   This information is not intended to replace advice given to you by your health care provider. Make sure you discuss any questions you have with your health care provider.   Document Released: 03/11/2004 Document Revised: 06/26/2014 Document Reviewed: 03/17/2014 Elsevier Interactive Patient Education 2016 Elsevier Inc.   Dental Pain Dental pain may be caused by many things, including:  Tooth decay (cavities or caries). Cavities expose the nerve of your tooth to air and hot or cold temperatures. This can cause pain or discomfort.  Abscess or infection. A dental abscess is a collection of infected pus from a bacterial infection in the inner part of the tooth (pulp). It usually occurs at the end of the tooth's root.  Injury.  An unknown reason (idiopathic). Your pain may be mild or severe. It may only occur when:  You are chewing.  You are exposed to hot or cold temperature.  You are eating or drinking sugary foods or beverages, such as soda or candy. Your pain may also be constant. HOME CARE INSTRUCTIONS Watch your dental pain for any changes. The following actions may help to lessen any discomfort that you are feeling:  Take medicines only as directed by your dentist.  If you were prescribed an antibiotic medicine, finish all of it even if you start to feel better.  Keep all follow-up visits as directed by your dentist. This is important.  Do not apply heat to the  outside of your face.  Rinse your mouth or gargle with salt water if directed by your dentist. This helps with pain and swelling.  You can make salt water by adding  tsp of salt to 1 cup of warm water.  Apply ice to the painful area of your face:  Put ice in a plastic bag.  Place a towel between your skin and the bag.  Leave the ice on for 20 minutes, 2-3 times per day.  Avoid foods or drinks that cause you pain, such as:  Very hot or very cold foods or drinks.  Sweet or sugary foods or  drinks. SEEK MEDICAL CARE IF:  Your pain is not controlled with medicines.  Your symptoms are worse.  You have new symptoms. SEEK IMMEDIATE MEDICAL CARE IF:  You are unable to open your mouth.  You are having trouble breathing or swallowing.  You have a fever.  Your face, neck, or jaw is swollen.   This information is not intended to replace advice given to you by your health care provider. Make sure you discuss any questions you have with your health care provider.   Document Released: 06/05/2005 Document Revised: 10/20/2014 Document Reviewed: 06/01/2014 Elsevier Interactive Patient Education 2016 [employer].   [employer] [facility] [employer] is a great source of information about community services available.  Access by dialing 2-1-1 from anywhere in [place], or by website -  PooledIncome.pl.   Other Local Resources (Updated 06/2015)  Dental  Care   Services    Phone Number and Address  Cost  [facility] For children 69 - 89 years of age:   Cleaning  Tooth brushing/flossing instruction  Sealants, fillings, crowns  Extractions  Emergency treatment  [phone] [address] [place], [place] [address] Charges based on family income.  Medicaid and some insurance plans accepted.     [facility] - [facility], fillings, crowns  Extractions  Emergency treatment [phone] [place], [place]  Pregnant women 58 years of age or older with a Medicaid card  [facility] - [place]  Cleaning  Sealants, fillings, crowns  Extractions  Emergency treatment [phone] [address] [place], [place] Pregnant women 75 years of age or older with a Medicaid card  [facility] Department of Health - [facility] For children 78 - 72 years of age:   Cleaning  Tooth  brushing/flossing instruction  Sealants, fillings, crowns  Extractions  Emergency treatment Limited orthodontic services for patients with Medicaid [phone] [address] [place], [place] [address] Medicaid and [facility] Health Choice cover for children up to age 3 and pregnant women.  Parents of children up to age 26 without Medicaid pay a reduced fee at time of service.  [facility] Department of [employer] For children 54 - 33 years of age:   Cleaning  Tooth brushing/flossing instruction  Sealants, fillings, crowns  Extractions  Emergency treatment Limited orthodontic services for patients with Medicaid [phone] [address] [place], [place].  Medicaid and [place] Health Choice cover for children up to age 73 and pregnant women.  Parents of children up to age 3 without Medicaid pay a reduced fee.  [facility]  Sealants, fillings, crowns  Extractions  Hours: Tuesdays and Thursdays, 4:15 - 8 pm [phone] [address] [place], [place] [address] Services free of charge to [facility] residents ages 18-64 who do not have health insurance, Medicare, [place], or [place] benefits and fall within federal poverty guidelines  [employer]    Provides dental care in addition to primary medical care, nutritional counseling, and pharmacy:  Cleaning  Sealants, fillings, crowns  Extractions                  [phone] [facility], [address] [place], [place]  [phone] [facility], [address] [place] [place], [place]  [phone] [facility] [place], [place]  [phone] [facility], [address] [place], [place]  [phone] [facility] [address] [place], [place] Accepts [place], [place], most insurance.  Also provides services  available to all with fees adjusted based on ability to pay.    [facility] Division of [facility]  Cleaning  Tooth brushing/flossing instruction  Sealants, fillings, crowns  Extractions  Emergency treatment Hours: Tuesdays, Thursdays, and Fridays from 8 am to 5 pm by appointment only. [phone] [place], [place] [address] [facility] residents with Medicaid (depending on eligibility) and children with [facility] Health Choice - call for more information.  [facility]  Extractions only  Hours: 2nd and 4th Thursday of each month from 6:30 am - 9 am.   [phone]. [phone] [place], [place] [address] Ages 44 and older only.  Patients are seen on a first come, first served basis.  [employer]  [employer]  Extractions  Orthodontics  Endodontics  Implants/Crowns/Bridges  Complete and partial dentures [phone] [place], [place] Patients must complete an application for services.  There is often a waiting list.
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
## 2015-10-17 NOTE — ED Notes (Signed)
Went in to medicate and assess pt.  Pt was standing with his face on the wall, reports R side of his head is hurting.  Pt kept his eyes closed.  Reports pain woke him up at 0200 this am.  Vomited x 10 at home and once here in the ED.  No active vomiting at this time.  Pt is A&Od 4.  Ambulates without difficulty.  No obvious neuro deficits noted.

## 2015-10-17 NOTE — ED Notes (Addendum)
Pt woke up at 0200 this am with dental pain and SOB. Pt speaking in full sentences with no difficulty. No hx of asthma. Has had a cough that started today. Also noticed a bruise on his L elbow yesterday that has gradually spread up and down arm. Had stitches put in a week ago on elbow, but no known injury since then. Also had several episodes of emesis for the last 2 hours. None in triage at present. Pt took home abx prior to episodes of emesis. Has been having nausea whenever he takes his abx (keflex). Also had 1 episode of diarrhea.

## 2015-10-20 ENCOUNTER — Encounter (HOSPITAL_COMMUNITY): Payer: Self-pay | Admitting: Emergency Medicine

## 2015-10-20 ENCOUNTER — Emergency Department (HOSPITAL_COMMUNITY)
Admission: EM | Admit: 2015-10-20 | Discharge: 2015-10-20 | Disposition: A | Discharge status: Home / Self Care | Payer: BLUE CROSS/BLUE SHIELD | Attending: Emergency Medicine | Admitting: Emergency Medicine

## 2015-10-20 DIAGNOSIS — Z8679 Personal history of other diseases of the circulatory system: Secondary | ICD-10-CM | POA: Diagnosis not present

## 2015-10-20 DIAGNOSIS — F17210 Nicotine dependence, cigarettes, uncomplicated: Secondary | ICD-10-CM | POA: Diagnosis not present

## 2015-10-20 DIAGNOSIS — Z79899 Other long term (current) drug therapy: Secondary | ICD-10-CM | POA: Insufficient documentation

## 2015-10-20 DIAGNOSIS — Z4802 Encounter for removal of sutures: Secondary | ICD-10-CM | POA: Diagnosis not present

## 2015-10-20 NOTE — ED Provider Notes (Signed)
CSN: [id number]     Arrival date & time 10/20/15  1044 History  By signing my name below, I, Sonum Patel, attest that this documentation has been prepared under the direction and in the presence of RaytheonJosh Sava Proby PA-C. Electronically Signed: Sonum Patel, Neurosurgeoncribe. 10/20/2015. 10:59 AM.    Chief Complaint  Patient presents with  . Suture / Staple Removal   The history is provided by the patient. No language interpreter was used.    HPI Comments: Thomas Ali is a 35 y.o. male who presents to the Emergency Department requesting suture removal today. Patient had 3 stiches placed on the left elbow on 10/10/15 for a laceration. He reports mild swelling to the affected area but states the wound is without drainage or discharge. He denies any associated symptoms at this time.   Past Medical History  Diagnosis Date  . Migraine    History reviewed. No pertinent past surgical history. No family history on file. Social History  Substance Use Topics  . Smoking status: Current Every Day Smoker -- 0.50 packs/day    Types: Cigarettes  . Smokeless tobacco: None  . Alcohol Use: No     Comment: Socially     Review of Systems  Constitutional: Negative for fever.  Gastrointestinal: Negative for nausea, vomiting and abdominal pain.  Skin: Positive for wound.    Allergies  Review of patient's allergies indicates no known allergies.  Home Medications   Prior to Admission medications   Medication Sig Start Date End Date Taking? Authorizing Provider  clindamycin (CLEOCIN) 150 MG capsule Take 2 capsules (300 mg total) by mouth 3 (three) times daily. May dispense as 150mg  capsules 10/17/15  Yes Roxy Horsemanobert Browning, PA-C  Multiple Vitamin (MULTIVITAMIN WITH MINERALS) TABS tablet Take 1 tablet by mouth 2 (two) times daily. Centrum Multivitamin   Yes Historical Provider, MD  traMADol (ULTRAM) 50 MG tablet Take 1 tablet (50 mg total) by mouth every 6 (six) hours as needed. 10/17/15  Yes Roxy Horsemanobert Browning, PA-C   ondansetron (ZOFRAN) 4 MG tablet Take 1 tablet (4 mg total) by mouth every 6 (six) hours. 10/17/15   Roxy Horsemanobert Browning, PA-C   BP 118/73 mmHg  Pulse 109  Temp(Src) 99.1 F (37.3 C) (Oral)  Resp 20  SpO2 98%   Physical Exam  Constitutional: He appears well-developed and well-nourished.  HENT:  Head: Normocephalic and atraumatic.  Eyes: Conjunctivae are normal.  Neck: Normal range of motion. Neck supple.  Cardiovascular: Normal rate.   Pulmonary/Chest: Effort normal. No respiratory distress.  Musculoskeletal:  Full range of motion of left elbow without pain.  Neurological: He is alert.  Skin: Skin is warm and dry.  2 cm left elbow laceration with 3 Prolene sutures in place. It appears to be well healing. No drainage. No surrounding tenderness or redness.  Psychiatric: He has a normal mood and affect.  Nursing note and vitals reviewed.   ED Course  Procedures (including critical care time)  DIAGNOSTIC STUDIES: Oxygen Saturation is 98% on RA, normal by my interpretation.    COORDINATION OF CARE: 11:03 AM Will remove stiches. Discussed treatment plan with pt at bedside and pt agreed to plan.   11:04 AM SUTURE REMOVAL Performed by: Rhea BleacherJosh Chanika Byland PA-C Authorized by: Rhea BleacherJosh Jaquae Rieves PA-C Consent: Verbal consent obtained. Consent given by: patient Required items: required blood products, implants, devices, and special equipment available  Time out: Immediately prior to procedure a "time out" was called to verify the correct patient, procedure, equipment, support staff and site/side  marked as required. Location: left elbow Wound Appearance: clean  Stiches Removed: 3 Post-removal: none Patient tolerance: Patient tolerated the procedure well with no immediate complications.  Vital signs reviewed and are as follows: Filed Vitals:   10/20/15 1049  BP: 118/73  Pulse: 109  Temp: 99.1 F (37.3 C)  Resp: 20   Pt urged to return with worsening pain, worsening swelling, expanding area  of redness or streaking up extremity, fever, or any other concerns. Pt verbalizes understanding and agrees with plan.   MDM   Final diagnoses:  Visit for suture removal   Patient with healed laceration, sutures removed without complication. Patient able to fully move at elbow without difficulty.  Also briefly examined abdominal wound which was clean, nondraining, well-healing.  I personally performed the services described in this documentation, which was scribed in my presence. The recorded information has been reviewed and is accurate.   Renne Crigler, PA-C 10/20/15 1111  Benjiman Core, MD 10/20/15 [phone]

## 2015-10-20 NOTE — Discharge Instructions (Signed)
Please read and follow all provided instructions.  Your diagnoses today include:  1. Visit for suture removal    Tests performed today include:  Vital signs. See below for your results today.   Medications prescribed:   None  Home care instructions:  Follow any educational materials contained in this packet.  Follow-up instructions: Please follow-up with your primary care provider as needed for further evaluation of your symptoms.  Return instructions:   Please return to the Emergency Department if you experience worsening symptoms.   Please return if you have any other emergent concerns.  Additional Information:  Your vital signs today were: BP 118/73 mmHg   Pulse 109   Temp(Src) 99.1 F (37.3 C) (Oral)   Resp 20   SpO2 98% If your blood pressure (BP) was elevated above 135/85 this visit, please have this repeated by your doctor within one month. ---------------

## 2015-10-20 NOTE — ED Notes (Signed)
Patient states needs sutures removed on his elbow.  Patient states has been in for 10 days.   Patient denies any problems.   No s/s of infection.

## 2015-10-22 ENCOUNTER — Inpatient Hospital Stay: Payer: BLUE CROSS/BLUE SHIELD
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
## 2017-03-08 ENCOUNTER — Emergency Department (HOSPITAL_COMMUNITY): Payer: BLUE CROSS/BLUE SHIELD

## 2017-03-08 ENCOUNTER — Emergency Department (HOSPITAL_COMMUNITY)
Admission: EM | Admit: 2017-03-08 | Discharge: 2017-03-08 | Disposition: A | Discharge status: Home / Self Care | Payer: BLUE CROSS/BLUE SHIELD | Attending: Emergency Medicine | Admitting: Emergency Medicine

## 2017-03-08 ENCOUNTER — Encounter (HOSPITAL_COMMUNITY): Payer: Self-pay | Admitting: Emergency Medicine

## 2017-03-08 DIAGNOSIS — Z79899 Other long term (current) drug therapy: Secondary | ICD-10-CM | POA: Insufficient documentation

## 2017-03-08 DIAGNOSIS — Y929 Unspecified place or not applicable: Secondary | ICD-10-CM | POA: Insufficient documentation

## 2017-03-08 DIAGNOSIS — Y939 Activity, unspecified: Secondary | ICD-10-CM | POA: Diagnosis not present

## 2017-03-08 DIAGNOSIS — W19XXXA Unspecified fall, initial encounter: Secondary | ICD-10-CM | POA: Diagnosis not present

## 2017-03-08 DIAGNOSIS — Y999 Unspecified external cause status: Secondary | ICD-10-CM | POA: Diagnosis not present

## 2017-03-08 DIAGNOSIS — S0990XA Unspecified injury of head, initial encounter: Secondary | ICD-10-CM | POA: Diagnosis present

## 2017-03-08 DIAGNOSIS — S0003XA Contusion of scalp, initial encounter: Secondary | ICD-10-CM | POA: Insufficient documentation

## 2017-03-08 DIAGNOSIS — F17210 Nicotine dependence, cigarettes, uncomplicated: Secondary | ICD-10-CM | POA: Diagnosis not present

## 2017-03-08 DIAGNOSIS — H1131 Conjunctival hemorrhage, right eye: Secondary | ICD-10-CM | POA: Insufficient documentation

## 2017-03-08 MED ORDER — IBUPROFEN 800 MG PO TABS: 800.0000 mg | ORAL_TABLET | Freq: Three times a day (TID) | ORAL | 0 refills | Status: DC

## 2017-03-08 MED ORDER — IBUPROFEN 800 MG PO TABS
800.0000 mg | ORAL_TABLET | Freq: Once | ORAL | Status: AC
  Administered 2017-03-08: 800 mg via ORAL
  Filled 2017-03-08: qty 1

## 2017-03-08 NOTE — ED Provider Notes (Signed)
[facility]-EMERGENCY DEPT Provider Note   CSN: [id number] Arrival date & time: 03/08/17  1418     History   Chief Complaint Chief Complaint  Patient presents with  . Headache  . Eye Injury    HPI Thomas Ali is a 36 y.o. male.  The history is provided by the patient. No language interpreter was used.  Headache   This is a new problem. The current episode started yesterday. The problem occurs constantly. The problem has been gradually worsening. The headache is associated with nothing. The pain is located in the occipital region. The pain is at a severity of 5/10. The pain is moderate. The pain does not radiate. He has tried nothing for the symptoms. The treatment provided no relief.  Eye Injury  Associated symptoms include headaches.  Pt complains of pain in his head.  Pt reports he fell and hit the back of his head.  Pt complains of a headache  Past Medical History:  Diagnosis Date  . Migraine     There are no active problems to display for this patient.   History reviewed. No pertinent surgical history.     Home Medications    Prior to Admission medications   Medication Sig Start Date End Date Taking? Authorizing Provider  clindamycin (CLEOCIN) 150 MG capsule Take 2 capsules (300 mg total) by mouth 3 (three) times daily. May dispense as  capsules 10/17/15   Roxy Horseman, PA-C  ibuprofen (ADVIL,MOTRIN) 800 MG tablet Take 1 tablet (800 mg total) by mouth 3 (three) times daily. 03/08/17   Elson Areas, PA-C  Multiple Vitamin (MULTIVITAMIN WITH MINERALS) TABS tablet Take 1 tablet by mouth 2 (two) times daily. Centrum Multivitamin    [provider]  ondansetron (ZOFRAN) 4 MG tablet Take 1 tablet (4 mg total) by mouth every 6 (six) hours. 10/17/15   Roxy Horseman, PA-C  traMADol (ULTRAM) 50 MG tablet Take 1 tablet (50 mg total) by mouth every 6 (six) hours as needed. 10/17/15   Roxy Horseman, PA-C    Family History No family history on  file.  Social History Social History  Substance Use Topics  . Smoking status: Current Every Day Smoker    Packs/day: 0.50    Types: Cigarettes  . Smokeless tobacco: Not on file  . Alcohol use No     Comment: Socially      Allergies   Patient has no known allergies.   Review of Systems Review of Systems  Neurological: Positive for headaches.  All other systems reviewed and are negative.    Physical Exam Updated Vital Signs BP 100/66 (BP Location: Right Arm)   Pulse 81   Temp 98.5 F (36.9 C) (Oral)   Resp 16   SpO2 98%   Physical Exam  Constitutional: He appears well-developed and well-nourished.  HENT:  Head: Normocephalic.  Right Ear: External ear normal.  Left Ear: External ear normal.  Nose: Nose normal.  Mouth/Throat: Oropharynx is clear and moist.  Eyes: Pupils are equal, round, and reactive to light. Conjunctivae and EOM are normal.  Subconjunctival hemmorhage,    Neck: Normal range of motion.  Cardiovascular: Normal rate.   Pulmonary/Chest: Effort normal.  Abdominal: Soft. Bowel sounds are normal.  Musculoskeletal: Normal range of motion.  Neurological: He is alert.  Skin: Skin is warm.  Psychiatric: He has a normal mood and affect.  Nursing note and vitals reviewed.    ED Treatments / Results  Labs (all labs ordered are listed, but only  abnormal results are displayed) Labs Reviewed - No data to display  EKG  EKG Interpretation None       Radiology Ct Head Wo Contrast  Result Date: 03/08/2017 CLINICAL DATA:  Acute headache, right orbital injury, recent fall, right orbital swelling EXAM: CT HEAD AND ORBITS WITHOUT CONTRAST TECHNIQUE: Contiguous axial images were obtained from the base of the skull through the vertex without contrast. Multidetector CT imaging of the orbits was performed using the standard protocol without intravenous contrast. COMPARISON:  05/06/2014 FINDINGS: CT HEAD FINDINGS Brain: No evidence of acute infarction,  hemorrhage, hydrocephalus, extra-axial collection or mass lesion/mass effect. Vascular: No hyperdense vessel or unexpected calcification. Skull: Normal. Negative for fracture or focal lesion. Other: None. CT ORBITS FINDINGS Orbits: No traumatic or inflammatory finding. Globes, optic nerves, orbital fat, extraocular muscles, and lacrimal glands are normal. No orbital fracture. Visualized sinuses: Very minimal scattered sinus mucosal thickening. No significant sinus opacification, hemorrhage or air-fluid level. Mastoids are also clear. Soft tissues: Minor right orbital and maxillary soft tissue swelling/ bruising. IMPRESSION: Normal head CT without contrast. Minor right facial soft tissue swelling. No other acute orbital abnormality. Electronically Signed   By: Judie Petit.  Shick M.D.   On: 03/08/2017 16:34   Ct Orbits Wo Contrast  Result Date: 03/08/2017 CLINICAL DATA:  Acute headache, right orbital injury, recent fall, right orbital swelling EXAM: CT HEAD AND ORBITS WITHOUT CONTRAST TECHNIQUE: Contiguous axial images were obtained from the base of the skull through the vertex without contrast. Multidetector CT imaging of the orbits was performed using the standard protocol without intravenous contrast. COMPARISON:  05/06/2014 FINDINGS: CT HEAD FINDINGS Brain: No evidence of acute infarction, hemorrhage, hydrocephalus, extra-axial collection or mass lesion/mass effect. Vascular: No hyperdense vessel or unexpected calcification. Skull: Normal. Negative for fracture or focal lesion. Other: None. CT ORBITS FINDINGS Orbits: No traumatic or inflammatory finding. Globes, optic nerves, orbital fat, extraocular muscles, and lacrimal glands are normal. No orbital fracture. Visualized sinuses: Very minimal scattered sinus mucosal thickening. No significant sinus opacification, hemorrhage or air-fluid level. Mastoids are also clear. Soft tissues: Minor right orbital and maxillary soft tissue swelling/ bruising. IMPRESSION: Normal  head CT without contrast. Minor right facial soft tissue swelling. No other acute orbital abnormality. Electronically Signed   By: Judie Petit.  Shick M.D.   On: 03/08/2017 16:34    Procedures Procedures (including critical care time)  Medications Ordered in ED Medications  ibuprofen (ADVIL,MOTRIN) tablet 800 mg (800 mg Oral Given 03/08/17 1721)     Initial Impression / Assessment and Plan / ED Course  I have reviewed the triage vital signs and the nursing notes.  Pertinent labs & imaging results that were available during my care of the patient were reviewed by me and considered in my medical decision making (see chart for details).       Final Clinical Impressions(s) / ED Diagnoses   Final diagnoses:  Subconjunctival hemorrhage of right eye  Contusion of scalp, initial encounter    New Prescriptions New Prescriptions   IBUPROFEN (ADVIL,MOTRIN) 800 MG TABLET    Take 1 tablet (800 mg total) by mouth 3 (three) times daily.  An After Visit Summary was printed and given to the patient.    Elson Areas, PA-C 03/08/17 1727    Linwood Dibbles, MD 03/09/17 [phone]

## 2017-03-08 NOTE — Discharge Instructions (Signed)
Return if any problems.

## 2017-03-08 NOTE — ED Triage Notes (Signed)
Pt complaint of headache and eye injury as result of fall yesterday; notable swelling to right eye; pt verbalizes able to see from right eye but here because "headache is bad."
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
## 2018-04-28 ENCOUNTER — Encounter (HOSPITAL_COMMUNITY): Payer: Self-pay | Admitting: Emergency Medicine

## 2018-04-28 ENCOUNTER — Emergency Department (HOSPITAL_COMMUNITY)
Admission: EM | Admit: 2018-04-28 | Discharge: 2018-04-28 | Disposition: A | Discharge status: Home / Self Care | Payer: BLUE CROSS/BLUE SHIELD | Attending: Emergency Medicine | Admitting: Emergency Medicine

## 2018-04-28 DIAGNOSIS — F17210 Nicotine dependence, cigarettes, uncomplicated: Secondary | ICD-10-CM | POA: Diagnosis not present

## 2018-04-28 DIAGNOSIS — L03115 Cellulitis of right lower limb: Secondary | ICD-10-CM | POA: Insufficient documentation

## 2018-04-28 DIAGNOSIS — H9202 Otalgia, left ear: Secondary | ICD-10-CM | POA: Diagnosis present

## 2018-04-28 DIAGNOSIS — Z79899 Other long term (current) drug therapy: Secondary | ICD-10-CM | POA: Insufficient documentation

## 2018-04-28 DIAGNOSIS — L0291 Cutaneous abscess, unspecified: Secondary | ICD-10-CM

## 2018-04-28 MED ORDER — LIDOCAINE-EPINEPHRINE 2 %-1:200000 IJ SOLN
10.0000 mL | Freq: Once | INTRAMUSCULAR | Status: AC
Start: 2018-04-28 — End: 2018-04-28
  Administered 2018-04-28: 10 mL via INTRADERMAL
  Filled 2018-04-28: qty 10

## 2018-04-28 MED ORDER — CEPHALEXIN 500 MG PO CAPS: 500.0000 mg | ORAL_CAPSULE | Freq: Four times a day (QID) | ORAL | 0 refills | Status: AC

## 2018-04-28 NOTE — ED Provider Notes (Addendum)
[facility] EMERGENCY DEPARTMENT Provider Note   CSN: [id number] Arrival date & time: 04/28/18  1024     History   Chief Complaint Chief Complaint  Patient presents with  . Abscess    HPI Thomas Ali is a 37 y.o. male.  The history is provided by the patient and medical records. No language interpreter was used.  Abscess  Location:  Pelvis Pelvic abscess location:  R buttock Size:  1 Abscess quality: fluctuance and redness   Abscess quality: not draining   Red streaking: no   Duration:  2 days Progression:  Worsening Chronicity:  New Context: not diabetes, not immunosuppression, not injected drug use, not insect bite/sting and not skin injury   Relieved by:  Nothing Worsened by:  Nothing Ineffective treatments:  None tried Associated symptoms: no fatigue, no fever, no headaches and no nausea   Risk factors: prior abscess     Past Medical History:  Diagnosis Date  . Migraine     There are no active problems to display for this patient.   History reviewed. No pertinent surgical history.      Home Medications    Prior to Admission medications   Medication Sig Start Date End Date Taking? Authorizing Provider  clindamycin (CLEOCIN) 150 MG capsule Take 2 capsules (300 mg total) by mouth 3 (three) times daily. May dispense as 150mg  capsules 10/17/15   Roxy Horseman, PA-C  ibuprofen (ADVIL,MOTRIN) 800 MG tablet Take 1 tablet (800 mg total) by mouth 3 (three) times daily. 03/08/17   Elson Areas, PA-C  Multiple Vitamin (MULTIVITAMIN WITH MINERALS) TABS tablet Take 1 tablet by mouth 2 (two) times daily. Centrum Multivitamin    [provider]  ondansetron (ZOFRAN) 4 MG tablet Take 1 tablet (4 mg total) by mouth every 6 (six) hours. 10/17/15   Roxy Horseman, PA-C  traMADol (ULTRAM) 50 MG tablet Take 1 tablet (50 mg total) by mouth every 6 (six) hours as needed. 10/17/15   Roxy Horseman, PA-C    Family History No family  history on file.  Social History Social History   Tobacco Use  . Smoking status: Current Every Day Smoker    Packs/day: 0.50    Types: Cigarettes  . Smokeless tobacco: Current User  Substance Use Topics  . Alcohol use: Yes    Comment: Socially   . Drug use: Yes    Types: Marijuana     Allergies   Patient has no known allergies.   Review of Systems Review of Systems  Constitutional: Negative for chills, fatigue and fever.  HENT: Negative for congestion, dental problem, drooling, facial swelling, sinus pain, tinnitus and trouble swallowing.   Respiratory: Negative for chest tightness, shortness of breath, wheezing and stridor.   Cardiovascular: Negative for chest pain and palpitations.  Gastrointestinal: Negative for nausea.  Genitourinary: Negative for frequency.  Musculoskeletal: Negative for back pain, neck pain and neck stiffness.  Skin: Positive for rash and wound.  Neurological: Negative for light-headedness and headaches.  Psychiatric/Behavioral: Negative for agitation.  All other systems reviewed and are negative.    Physical Exam Updated Vital Signs BP 113/73 (BP Location: Right Arm)   Pulse 92   Temp 98.4 F (36.9 C) (Oral)   Resp 16   Ht 5\' 8"  (1.727 m)   Wt 58.5 kg   SpO2 100%   BMI 19.61 kg/m   Physical Exam  Constitutional: He is oriented to person, place, and time. He appears well-developed and well-nourished. No distress.  HENT:  Head: Normocephalic and atraumatic.  Nose: Nose normal.  Mouth/Throat: Oropharynx is clear and moist. No oropharyngeal exudate.  Eyes: Pupils are equal, round, and reactive to light. Conjunctivae and EOM are normal.  Neck: Normal range of motion. Neck supple.  Cardiovascular: Normal rate and regular rhythm.  No murmur heard. Pulmonary/Chest: Effort normal and breath sounds normal. No respiratory distress. He has no wheezes. He has no rales. He exhibits no tenderness.  Abdominal: Soft. There is no tenderness.    Genitourinary: Rectal exam shows no external hemorrhoid, no internal hemorrhoid, no mass, no tenderness and anal tone normal.    Right testis shows no tenderness. Left testis shows no tenderness.     Musculoskeletal: He exhibits no edema or tenderness.       Right foot: There is no tenderness, no bony tenderness, no swelling, no crepitus and no laceration.       Feet:  Normal sensation, pulse, and cap refill of foot.  Neurological: He is alert and oriented to person, place, and time. No sensory deficit. He exhibits normal muscle tone.  Skin: Skin is warm and dry. Capillary refill takes less than 2 seconds. Rash noted. He is not diaphoretic. No erythema. No pallor.  Psychiatric: He has a normal mood and affect.  Nursing note and vitals reviewed.    ED Treatments / Results  Labs (all labs ordered are listed, but only abnormal results are displayed) Labs Reviewed - No data to display  EKG None  Radiology No results found.  Procedures Procedures (including critical care time)  Medications Ordered in ED Medications  lidocaine-EPINEPHrine (XYLOCAINE W/EPI) 2 %-1:200000 (PF) injection 10 mL (10 mLs Intradermal Given by Other 04/28/18 1144)     Initial Impression / Assessment and Plan / ED Course  I have reviewed the triage vital signs and the nursing notes.  Pertinent labs & imaging results that were available during my care of the patient were reviewed by me and considered in my medical decision making (see chart for details).     Thomas Ali is a 37 y.o. male with a past medical history significant for migraines who presents with recent infection on foot, concern for perianal abscess, and irritation on his groin.  Patient reports that over the last several days he has developed irritation on his penis and groin area.  He reports that he has been handling a new lubricant at work which was called DTE 24 and DTE 25.  He thinks he has washed his hands well but he agrees  that he may have exposed himself while using the bathroom at work.  He denies any pain or dysuria.  He denies any problems with urinating.  He denies constipation or diarrhea but does report that he has had some pain near his rectum.  He has never had an infection down here before.  He says that about a month ago he had a boil on his foot which he drained.  He reports that there is still some redness surrounding it is still hurt slightly.  He reports is able to walk.  He denies fevers, chills, chest pain, shortness breath, nausea, vomiting, or other symptoms.  On exam, patient has evidence of irritation on the shaft of his penis, the glans, and onto the tissue surrounding the base of the penis.  Patient has nontender scrotum or testicles.  No evidence of hernia.  Patient has a small fluctuant area near his rectum that ultrasound was utilized and a small pocket was  discovered.  Patient will have abscess drainage performed.  Patient's foot had an area of scab from a prior infection.  There is some surrounding erythema, will likely consider antibiotics.    12:55 PM Patient's perianal abscess was drained.  No complications.  Large amount of pus was removed.  Patient had extreme relief of discomfort.  Next para patient be given prescription for antibiotics for the foot and his abscess and will follow up with his PCP.  The safety data sheets were examined for both of the lubricant materials he may have been in contact with any both report mild irritation to skin if exposed.  I suspect this is the cause of the penile irritation.  Patient will avoid these chemicals and follow-up with his primary doctor.    Patient understood return precautions for any new or worsened symptoms.  Patient discharged in good condition.   Final Clinical Impressions(s) / ED Diagnoses   Final diagnoses:  Abscess  Cellulitis of right foot    ED Discharge Orders         Ordered    cephALEXin (KEFLEX) 500 MG capsule  4 times daily      04/28/18 1300          Clinical Impression: 1. Abscess   2. Cellulitis of right foot     Disposition: Discharge  Condition: Good  I have discussed the results, Dx and Tx plan with the pt(& family if present). He/she/they expressed understanding and agree(s) with the plan. Discharge instructions discussed at great length. Strict return precautions discussed and pt &/or family have verbalized understanding of the instructions. No further questions at time of discharge.    Discharge Medication List as of 04/28/2018  1:01 PM    START taking these medications   Details  cephALEXin (KEFLEX) 500 MG capsule Take 1 capsule (500 mg total) by mouth 4 (four) times daily for 7 days., Starting Sun 04/28/2018, Until Sun 05/05/2018, Print        Follow Up: [facility] [address] [place] [address] [phone] Schedule an appointment as soon as possible for a visit    [facility] EMERGENCY DEPARTMENT [address] [id number] mc [place] [address] [phone]         Kalani Baray, Canary Brim, MD 04/28/18 2001    Saadiya Wilfong, Canary Brim, MD 04/28/18 2005

## 2018-04-28 NOTE — Discharge Instructions (Signed)
Your work-up and exam today showed evidence of the abscess pocket of infection near your rectum which we drained.  There was no complication with this.  It will continue to drain for the next few days, please keep it covered with the gauze we provided.  Please take the antibiotics to help cover the infection in your foot as well.  Please take the out box 4 times a day for the next week.  Please watch for signs and symptoms of worsened infection.  Please stay hydrated.  Please rest.  Please follow-up with a primary doctor for further evaluation of the infection as well as irritation on her groin.  I suspect this may be related to chemicals that he got exposed.  No evidence of infection on the site.  If any symptoms change or worsen, please return to the nearest emergency department.

## 2018-04-28 NOTE — ED Triage Notes (Signed)
Pt. Stated, I have a boil on my butt and on my rt. Foot. The one on my foot has been there for a month.

## 2018-07-18 ENCOUNTER — Encounter (HOSPITAL_COMMUNITY): Payer: Self-pay | Admitting: Emergency Medicine

## 2018-07-18 ENCOUNTER — Other Ambulatory Visit: Payer: Self-pay

## 2018-07-18 ENCOUNTER — Emergency Department (HOSPITAL_COMMUNITY): Payer: BLUE CROSS/BLUE SHIELD

## 2018-07-18 ENCOUNTER — Emergency Department (HOSPITAL_COMMUNITY)
Admission: EM | Admit: 2018-07-18 | Discharge: 2018-07-18 | Disposition: A | Discharge status: Home / Self Care | Payer: BLUE CROSS/BLUE SHIELD | Attending: Emergency Medicine | Admitting: Emergency Medicine

## 2018-07-18 DIAGNOSIS — Z79899 Other long term (current) drug therapy: Secondary | ICD-10-CM | POA: Insufficient documentation

## 2018-07-18 DIAGNOSIS — M5442 Lumbago with sciatica, left side: Secondary | ICD-10-CM | POA: Insufficient documentation

## 2018-07-18 DIAGNOSIS — F17210 Nicotine dependence, cigarettes, uncomplicated: Secondary | ICD-10-CM | POA: Insufficient documentation

## 2018-07-18 DIAGNOSIS — M545 Low back pain: Secondary | ICD-10-CM | POA: Diagnosis present

## 2018-07-18 MED ORDER — LIDOCAINE 5 % EX PTCH
1.0000 | MEDICATED_PATCH | CUTANEOUS | Status: DC
  Administered 2018-07-18: 1 via TRANSDERMAL
  Filled 2018-07-18: qty 1

## 2018-07-18 MED ORDER — CYCLOBENZAPRINE HCL 5 MG PO TABS
7.5000 mg | ORAL_TABLET | Freq: Once | ORAL | Status: DC
  Filled 2018-07-18 (×2): qty 1.5

## 2018-07-18 MED ORDER — CYCLOBENZAPRINE HCL 10 MG PO TABS: 10.0000 mg | ORAL_TABLET | Freq: Two times a day (BID) | ORAL | 0 refills | Status: DC | PRN

## 2018-07-18 MED ORDER — NAPROXEN 375 MG PO TABS: 375.0000 mg | ORAL_TABLET | Freq: Two times a day (BID) | ORAL | 0 refills | Status: DC

## 2018-07-18 MED ORDER — OXYCODONE HCL 5 MG PO TABS
5.0000 mg | ORAL_TABLET | Freq: Once | ORAL | Status: AC
  Administered 2018-07-18: 5 mg via ORAL
  Filled 2018-07-18: qty 1

## 2018-07-18 MED ORDER — KETOROLAC TROMETHAMINE 15 MG/ML IJ SOLN
15.0000 mg | Freq: Once | INTRAMUSCULAR | Status: AC
  Administered 2018-07-18: 15 mg via INTRAMUSCULAR
  Filled 2018-07-18: qty 1

## 2018-07-18 NOTE — ED Notes (Signed)
Patient verbalizes understanding of discharge instructions. Opportunity for questioning and answers were provided. Armband removed by staff, pt discharged from ED via wheelchair to home.  

## 2018-07-18 NOTE — Discharge Instructions (Addendum)
Workup has been normal. Please take medications as prescribed and instructed. ° °Please take the Naproxen as prescribed for pain. Do not take any additional NSAIDs including Motrin, Aleve, Ibuprofen, Advil. ° °Please the the flexeril for muscle relaxation. This medication will make you drowsy so avoid situation that could place you in danger.  ° ° °SEEK IMMEDIATE MEDICAL ATTENTION IF: °New numbness, tingling, weakness, or problem with the use of your arms or legs.  °Severe back pain not relieved with medications.  °Change in bowel or bladder control.  °Urinary retention.  °Numbness in your groin.  °Increasing pain in any areas of the body (such as chest or abdominal pain).  °Shortness of breath, dizziness or fainting.  °Nausea (feeling sick to your stomach), vomiting, fever, or sweats. ° °

## 2018-07-18 NOTE — ED Provider Notes (Signed)
[facility] EMERGENCY DEPARTMENT Provider Note   CSN: [id number] Arrival date & time: 07/18/18  2025     History   Chief Complaint No chief complaint on file.   HPI Thomas Ali is a 38 y.o. male.  HPI 38 year old male with no pertinent past medical history presents to the ED for evaluation of left low back pain.  Patient states it has been ongoing for the past week.  States that happened after he was lifting heavy equipment at work.  Patient states that the pain is in his left lower back and radiates down his left buttocks into his left thigh.  Pain is worse with palpation, range of motion and prolonged sitting on the left side.  Patient has taken Tylenol and a shot of liquor for his symptoms without any relief.  He reports intermittent paresthesias down the left leg.  Patient denies any urinary symptoms: Dysuria, hematuria, urgency or frequency.  Denies any nausea or vomit.  Denies any fevers or chills.  Patient denies any history of IV drug use, history of cancer, urinary retention, saddle paresthesias, loss of bowel or bladder.  Denies any abdominal pain or groin pain. Past Medical History:  Diagnosis Date  . Migraine     There are no active problems to display for this patient.   History reviewed. No pertinent surgical history.      Home Medications    Prior to Admission medications   Medication Sig Start Date End Date Taking? Authorizing Provider  clindamycin (CLEOCIN) 150 MG capsule Take 2 capsules (300 mg total) by mouth 3 (three) times daily. May dispense as 150mg  capsules 10/17/15   Roxy Horseman, PA-C  ibuprofen (ADVIL,MOTRIN) 800 MG tablet Take 1 tablet (800 mg total) by mouth 3 (three) times daily. 03/08/17   Elson Areas, PA-C  Multiple Vitamin (MULTIVITAMIN WITH MINERALS) TABS tablet Take 1 tablet by mouth 2 (two) times daily. Centrum Multivitamin    [provider]  ondansetron (ZOFRAN) 4 MG tablet Take 1 tablet (4 mg total)  by mouth every 6 (six) hours. 10/17/15   Roxy Horseman, PA-C  traMADol (ULTRAM) 50 MG tablet Take 1 tablet (50 mg total) by mouth every 6 (six) hours as needed. 10/17/15   Roxy Horseman, PA-C    Family History No family history on file.  Social History Social History   Tobacco Use  . Smoking status: Current Every Day Smoker    Packs/day: 0.50    Types: Cigarettes  . Smokeless tobacco: Current User  Substance Use Topics  . Alcohol use: Yes    Comment: Socially   . Drug use: Yes    Types: Marijuana     Allergies   Patient has no known allergies.   Review of Systems Review of Systems  Constitutional: Negative for chills and fever.  Eyes: Negative for discharge.  Gastrointestinal: Negative for abdominal pain, nausea and vomiting.  Genitourinary: Negative for dysuria, flank pain, frequency, hematuria and urgency.  Musculoskeletal: Positive for arthralgias, back pain and myalgias. Negative for gait problem.  Skin: Negative for rash.  Neurological: Negative for headaches.     Physical Exam Updated Vital Signs BP 108/82 (BP Location: Right Arm)   Pulse (!) 102   Temp 98.4 F (36.9 C) (Oral)   Resp 17   Ht 5\' 7"  (1.702 m)   Wt 59 kg   SpO2 98%   BMI 20.36 kg/m   Physical Exam Vitals signs and nursing note reviewed.  Constitutional:  General: He is not in acute distress.    Appearance: He is well-developed.  HENT:     Head: Normocephalic and atraumatic.  Eyes:     General: No scleral icterus.       Right eye: No discharge.        Left eye: No discharge.  Neck:     Musculoskeletal: Normal range of motion.  Pulmonary:     Effort: No respiratory distress.  Abdominal:     General: Abdomen is flat. Bowel sounds are normal.     Palpations: Abdomen is soft.     Tenderness: There is no right CVA tenderness or left CVA tenderness.  Musculoskeletal: Normal range of motion.     Comments: Patient with no midline L-spine or T-spine tenderness.  No deformities  or step-offs noted.  Does have pain to palpation of the left lumbar paraspinal region that radiates to the left buttocks and down to his posterior left thigh.  Patient has a positive straight leg raise test on the left.  There is no paresthesias straight leg raise.  Patient is DP pulses are 2+ bilaterally.  Sensation intact.  Brisk cap refill.  Full range of motion of all joints the left lower leg.  Patient has no CVA tenderness.  Skin:    Coloration: Skin is not pale.  Neurological:     Mental Status: He is alert.     Comments: Strength equal bilaterally in lower extremities.  Psychiatric:        Behavior: Behavior normal.        Thought Content: Thought content normal.        Judgment: Judgment normal.      ED Treatments / Results  Labs (all labs ordered are listed, but only abnormal results are displayed) Labs Reviewed - No data to display  EKG None  Radiology Dg Lumbar Spine Complete  Result Date: 07/18/2018 CLINICAL DATA:  Low back and left lower extremity pain after moving heavy equipment at work. EXAM: LUMBAR SPINE - COMPLETE 4+ VIEW COMPARISON:  CT scan of October 10, 2015. FINDINGS: There is no evidence of lumbar spine fracture. Alignment is normal. Intervertebral disc spaces are maintained. IMPRESSION: Negative. Electronically Signed   By: Lupita Raider, M.D.   On: 07/18/2018 09:51    Procedures Procedures (including critical care time)  Medications Ordered in ED Medications  ketorolac (TORADOL) 15 MG/ML injection 15 mg (has no administration in time range)  lidocaine (LIDODERM) 5 % 1 patch (has no administration in time range)  cyclobenzaprine (FLEXERIL) tablet 7.5 mg (has no administration in time range)     Initial Impression / Assessment and Plan / ED Course  I have reviewed the triage vital signs and the nursing notes.  Pertinent labs & imaging results that were available during my care of the patient were reviewed by me and considered in my medical decision  making (see chart for details).     Patient with back pain.  No neurological deficits and normal neuro exam.  Patient can walk but states is painful.  No loss of bowel or bladder control.  No concern for cauda equina.  Presentation not consistent with kidney stone, pyelonephritis.  Suspect musculoskeletal pain for sciatic nerve pain.  No fever, night sweats, weight loss, h/o cancer, IVDU.  RICE protocol and pain medicine indicated and discussed with patient.   Pt is hemodynamically stable, in NAD, & able to ambulate in the ED. Evaluation does not show pathology that would require ongoing emergent  intervention or inpatient treatment. I explained the diagnosis to the patient. Pain has been managed & has no complaints prior to dc. Pt is comfortable with above plan and is stable for discharge at this time. All questions were answered prior to disposition. Strict return precautions for f/u to the ED were discussed. Encouraged follow up with PCP.    Final Clinical Impressions(s) / ED Diagnoses   Final diagnoses:  Acute left-sided low back pain with left-sided sciatica    ED Discharge Orders         Ordered    cyclobenzaprine (FLEXERIL) 10 MG tablet  2 times daily PRN     07/18/18 1014    naproxen (NAPROSYN) 375 MG tablet  2 times daily     07/18/18 [address]1014           Martie Fulgham T, PA-C 07/18/18 1020    Wynetta FinesMessick, Peter C, MD 07/19/18 [phone]

## 2018-07-18 NOTE — ED Triage Notes (Signed)
Pt complains of lower back pain and left thigh pain. Pain symptoms started x3 days ago. Pt states he was moving heavy equipment.

## 2018-09-15 ENCOUNTER — Encounter (HOSPITAL_COMMUNITY): Payer: Self-pay | Admitting: *Deleted

## 2018-09-15 ENCOUNTER — Emergency Department (HOSPITAL_COMMUNITY)
Admission: EM | Admit: 2018-09-15 | Discharge: 2018-09-15 | Disposition: A | Discharge status: Home / Self Care | Payer: BLUE CROSS/BLUE SHIELD | Attending: Emergency Medicine | Admitting: Emergency Medicine

## 2018-09-15 ENCOUNTER — Other Ambulatory Visit: Payer: Self-pay

## 2018-09-15 DIAGNOSIS — F17210 Nicotine dependence, cigarettes, uncomplicated: Secondary | ICD-10-CM | POA: Insufficient documentation

## 2018-09-15 DIAGNOSIS — H1045 Other chronic allergic conjunctivitis: Secondary | ICD-10-CM | POA: Diagnosis not present

## 2018-09-15 DIAGNOSIS — H1013 Acute atopic conjunctivitis, bilateral: Secondary | ICD-10-CM

## 2018-09-15 DIAGNOSIS — Z79899 Other long term (current) drug therapy: Secondary | ICD-10-CM | POA: Diagnosis not present

## 2018-09-15 DIAGNOSIS — H5789 Other specified disorders of eye and adnexa: Secondary | ICD-10-CM | POA: Diagnosis present

## 2018-09-15 DIAGNOSIS — Z09 Encounter for follow-up examination after completed treatment for conditions other than malignant neoplasm: Secondary | ICD-10-CM

## 2018-09-15 NOTE — ED Provider Notes (Signed)
[facility] EMERGENCY DEPARTMENT Provider Note   CSN: [id number] Arrival date & time: 09/15/18  0177    History   Chief Complaint Chief Complaint  Patient presents with  . Letter for School/Work    HPI Thomas Ali is a 38 y.o. male presenting today requesting work note.  Patient reports that 2 days ago while at work he was cleaning a car that was covered in pollen, he states that he then accidentally rubbed his eyes with the pollen on his hand and shortly afterwards his eyes became red and itchy.  Reports history of allergies to pollen, he states that eye itching, redness and some swelling is his normal reaction to pollen.  Patient denies any eye pain, drainage or visual changes.  Patient reports that his boss noticed that his eyes were red and sent him home requesting that he be tested for the novel coronavirus prior to returning to work.  Patient reports that his bilateral eye redness, itching and mild swelling spontaneously resolved after a few hours.  He has had no symptoms since that time.  Patient denies any history of fever, cough, shortness of breath, recent travel or contact with known COVID-19 patients.  Patient states that he has been feeling very well and in his usual state of health.    HPI  Past Medical History:  Diagnosis Date  . Migraine     There are no active problems to display for this patient.   History reviewed. No pertinent surgical history.      Home Medications    Prior to Admission medications   Medication Sig Start Date End Date Taking? Authorizing Provider  clindamycin (CLEOCIN) 150 MG capsule Take 2 capsules (300 mg total) by mouth 3 (three) times daily. May dispense as 150mg  capsules 10/17/15   Roxy Horseman, PA-C  cyclobenzaprine (FLEXERIL) 10 MG tablet Take 1 tablet (10 mg total) by mouth 2 (two) times daily as needed for muscle spasms. 07/18/18   Rise Mu, PA-C  ibuprofen (ADVIL,MOTRIN) 800 MG tablet Take  1 tablet (800 mg total) by mouth 3 (three) times daily. 03/08/17   Elson Areas, PA-C  Multiple Vitamin (MULTIVITAMIN WITH MINERALS) TABS tablet Take 1 tablet by mouth 2 (two) times daily. Centrum Multivitamin    [provider]  naproxen (NAPROSYN) 375 MG tablet Take 1 tablet (375 mg total) by mouth 2 (two) times daily. 07/18/18   Rise Mu, PA-C  ondansetron (ZOFRAN) 4 MG tablet Take 1 tablet (4 mg total) by mouth every 6 (six) hours. 10/17/15   Roxy Horseman, PA-C  traMADol (ULTRAM) 50 MG tablet Take 1 tablet (50 mg total) by mouth every 6 (six) hours as needed. 10/17/15   Roxy Horseman, PA-C    Family History History reviewed. No pertinent family history.  Social History Social History   Tobacco Use  . Smoking status: Current Every Day Smoker    Packs/day: 0.50    Types: Cigarettes  . Smokeless tobacco: Current User  Substance Use Topics  . Alcohol use: Yes    Comment: Socially   . Drug use: Yes    Types: Marijuana     Allergies   Patient has no known allergies.   Review of Systems Review of Systems  Constitutional: Negative.  Negative for chills, fatigue and fever.  HENT: Positive for facial swelling (Mild periorbital swelling consistent with his normal allergies resolved 2 days ago). Negative for congestion, rhinorrhea, sinus pressure, sinus pain, sore throat, trouble swallowing and  voice change.   Eyes: Positive for redness (Resolved 2 days ago) and itching (Resolved 2 days ago). Negative for photophobia, pain, discharge and visual disturbance.  Respiratory: Negative.  Negative for cough and shortness of breath.   Cardiovascular: Negative.  Negative for chest pain.  Gastrointestinal: Negative.  Negative for abdominal pain, diarrhea, nausea and vomiting.  Musculoskeletal: Negative.  Negative for neck pain and neck stiffness.  Skin: Negative.  Negative for rash.  Neurological: Negative.  Negative for headaches.   Physical Exam Updated Vital  Signs BP 132/90 (BP Location: Right Arm)   Pulse 73   Temp 98.7 F (37.1 C) (Oral)   Resp 18   Ht 5\' 7"  (1.702 m)   Wt 59 kg   SpO2 98%   BMI 20.36 kg/m   Physical Exam Constitutional:      General: He is not in acute distress.    Appearance: Normal appearance. He is well-developed. He is not ill-appearing or diaphoretic.  HENT:     Head: Normocephalic and atraumatic.     Jaw: There is normal jaw occlusion. No trismus.     Comments: No periorbital swelling.    Right Ear: Tympanic membrane, ear canal and external ear normal.     Left Ear: Tympanic membrane, ear canal and external ear normal.     Nose: Nose normal. No congestion or rhinorrhea.     Right Sinus: No maxillary sinus tenderness or frontal sinus tenderness.     Left Sinus: No maxillary sinus tenderness or frontal sinus tenderness.     Mouth/Throat:     Mouth: Mucous membranes are moist.     Pharynx: Oropharynx is clear. Uvula midline.     Comments: The patient has normal phonation and is in control of secretions. No stridor.  Midline uvula without edema. Soft palate rises symmetrically. No tonsillar erythema, swelling or exudates. Tongue protrusion is normal, floor of mouth is soft. No trismus. No creptius on neck palpation. No gingival erythema or fluctuance noted. Mucus membranes moist. Eyes:     General: Lids are normal. Vision grossly intact. Gaze aligned appropriately.     Extraocular Movements: Extraocular movements intact.     Conjunctiva/sclera: Conjunctivae normal.     Pupils: Pupils are equal, round, and reactive to light.     Comments: Visual fields grossly intact bilaterally. No pain with extraocular motion.  Bilateral eyes normal appearance.  No erythema or scleral icterus. No discharge. Conjunctiva clear. PEERL intact. EOMI without nystagmus. No photophobia or consensual photophobia. No foreign bodies noted. No visible hyphema.  Neck:     Musculoskeletal: Normal range of motion and neck supple.      Trachea: Trachea and phonation normal. No tracheal deviation.  Pulmonary:     Effort: Pulmonary effort is normal. No respiratory distress.  Abdominal:     General: There is no distension.     Palpations: Abdomen is soft.     Tenderness: There is no abdominal tenderness. There is no guarding or rebound.  Musculoskeletal: Normal range of motion.  Skin:    General: Skin is warm and dry.  Neurological:     Mental Status: He is alert.     GCS: GCS eye subscore is 4. GCS verbal subscore is 5. GCS motor subscore is 6.     Comments: Speech is clear and goal oriented, follows commands Major Cranial nerves without deficit, no facial droop Moves extremities without ataxia, coordination intact Normal gait  Psychiatric:        Behavior: Behavior normal.  ED Treatments / Results  Labs (all labs ordered are listed, but only abnormal results are displayed) Labs Reviewed - No data to display  EKG None  Radiology No results found.  Procedures Procedures (including critical care time)  Medications Ordered in ED Medications - No data to display   Initial Impression / Assessment and Plan / ED Course  I have reviewed the triage vital signs and the nursing notes.  Pertinent labs & imaging results that were available during my care of the patient were reviewed by me and considered in my medical decision making (see chart for details).    Thomas Ali was evaluated in Emergency Department on 09/15/2018 for the symptoms described in the history of present illness. He was evaluated in the context of the global COVID-19 pandemic, which necessitated consideration that the patient might be at risk for infection with the SARS-CoV-2 virus that causes COVID-19. Institutional protocols and algorithms that pertain to the evaluation of patients at risk for COVID-19 are in a state of rapid change based on information released by regulatory bodies including the CDC and federal and state organizations.  These policies and algorithms were followed during the patient's care in the ED.  Patient without history of fever/chills, cough or shortness of breath.  Patient has no recent travel history or contact with known COVID-19 patient's.  Current virus testing is currently not indicated per current guidelines. ---------------------- 38 year old otherwise healthy male presents 2 days after what appears to be allergic conjunctivitis which has resolved.  Patient has been asymptomatic for the past 2 days.  He has no visual changes, pain or swelling.  Physical examination today is reassuring.  There is no erythema, drainage, swelling or entrapment.  Pupils equal round and reactive to light and accommodating, EOMI, visual fields grossly intact bilaterally. He reports that he feels very well and is only requesting a work note required by his employer.  I have discussed with the patient current guidelines concerning novel coronavirus testing and that is not indicated today, he states understanding.  I have provided patient with 2 week work note and encouraged PCP follow-up.  Patient is agreeable to care plan and is requesting discharge as soon as possible.  At this time there does not appear to be any evidence of an acute emergency medical condition and the patient appears stable for discharge with appropriate outpatient follow up. Diagnosis was discussed with patient who verbalizes understanding of care plan and is agreeable to discharge. I have discussed return precautions with patient who verbalizes understanding of return precautions. Patient encouraged to follow-up with their PCP. All questions answered. Patient has been discharged in good condition.   Note: Portions of this report may have been transcribed using voice recognition software. Every effort was made to ensure accuracy; however, inadvertent computerized transcription errors may still be present. Final Clinical Impressions(s) / ED Diagnoses   Final  diagnoses:  Follow-up exam  Allergic conjunctivitis of both eyes    ED Discharge Orders    None       Elizabeth Palau 09/15/18 1102    Margarita Grizzle, MD 09/15/18 1455

## 2018-09-15 NOTE — Discharge Instructions (Addendum)
You have been diagnosed today with follow-up exam for likely allergic conjunctivitis.  At this time there does not appear to be the presence of an emergent medical condition, however there is always the potential for conditions to change. Please read and follow the below instructions.  Please return to the Emergency Department immediately for any new or worsening symptoms. Please be sure to follow up with your Primary Care Provider within one week regarding your visit today; please call their office to schedule an appointment even if you are feeling better for a follow-up visit. As we discussed per current guidelines testing for coronavirus is not indicated at this time.  Please self isolate for the next 2 week and follow-up with your primary care provider.  Return for new or worsening symptoms.  Get help right away if: You have redness, swelling, or other symptoms in your eye. Your vision is blurry. You have vision changes. You have eye pain. You have fever  Any new or concerning symptoms  Please read the additional information packets attached to your discharge summary. -  If you live with, or provide care at home for, a person confirmed to have, or being evaluated for, COVID-19 infection please follow these guidelines to prevent infection:  Follow healthcare providers instructions Make sure that you understand and can help the patient follow any healthcare provider instructions for all care.  Provide for the patients basic needs You should help the patient with basic needs in the home and provide support for getting groceries, prescriptions, and other personal needs.  Monitor the patients symptoms If they are getting sicker, call his or her medical provider a  This will help the healthcare providers office take steps to keep other people from getting infected. Ask the healthcare provider to call the local or state health department.  Limit the number of people who have  contact with the patient If possible, have only one caregiver for the patient. Other household members should stay in another home or place of residence. If this is not possible, they should stay in another room, or be separated from the patient as much as possible. Use a separate bathroom, if available. Restrict visitors who do not have an essential need to be in the home.  Keep older adults, very young children, and other sick people away from the patient Keep older adults, very young children, and those who have compromised immune systems or chronic health conditions away from the patient. This includes people with chronic heart, lung, or kidney conditions, diabetes, and cancer.  Ensure good ventilation Make sure that shared spaces in the home have good air flow, such as from an air conditioner or an opened window, weather permitting.  Wash your hands often Wash your hands often and thoroughly with soap and water for at least 20 seconds. You can use an alcohol based hand sanitizer if soap and water are not available and if your hands are not visibly dirty. Avoid touching your eyes, nose, and mouth with unwashed hands. Use disposable paper towels to dry your hands. If not available, use dedicated cloth towels and replace them when they become wet.  Wear a facemask and gloves Wear a disposable facemask at all times in the room and gloves when you touch or have contact with the patients blood, body fluids, and/or secretions or excretions, such as sweat, saliva, sputum, nasal mucus, vomit, urine, or feces.  Ensure the mask fits over your nose and mouth tightly, and do not touch it during  use. Throw out disposable facemasks and gloves after using them. Do not reuse. Wash your hands immediately after removing your facemask and gloves. If your personal clothing becomes contaminated, carefully remove clothing and launder. Wash your hands after handling contaminated clothing. Place all used  disposable facemasks, gloves, and other waste in a lined container before disposing them with other household waste. Remove gloves and wash your hands immediately after handling these items.  Do not share dishes, glasses, or other household items with the patient Avoid sharing household items. You should not share dishes, drinking glasses, cups, eating utensils, towels, bedding, or other items After the person uses these items, you should wash them thoroughly with soap and water.  Wash laundry thoroughly Immediately remove and wash clothes or bedding that have blood, body fluids, and/or secretions or excretions, such as sweat, saliva, sputum, nasal mucus, vomit, urine, or feces, on them. Wear gloves when handling laundry from the patient. Read and follow directions on labels of laundry or clothing items and detergent. In general, wash and dry with the warmest temperatures recommended on the label.  Clean all areas the individual has used often Clean all touchable surfaces, such as counters, tabletops, doorknobs, bathroom fixtures, toilets, phones, keyboards, tablets, and bedside tables, every day. Also, clean any surfaces that may have blood, body fluids, and/or secretions or excretions on them. Wear gloves when cleaning surfaces the patient has come in contact with. Use a diluted bleach solution (e.g., dilute bleach with 1 part bleach and 10 parts water) or a household disinfectant with a label that says EPA-registered for coronaviruses. To make a bleach solution at home, add 1 tablespoon of bleach to 1 quart (4 cups) of water. For a larger supply, add  cup of bleach to 1 gallon (16 cups) of water. Read labels of cleaning products and follow recommendations provided on product labels. Labels contain instructions for safe and effective use of the cleaning product including precautions you should take when applying the product, such as wearing gloves or eye protection and making sure you have good  ventilation during use of the product. Remove gloves and wash hands immediately after cleaning.  Monitor yourself for signs and symptoms of illness Caregivers and household members are considered close contacts, should monitor their health, and will be asked to limit movement outside of the home to the extent possible. Follow the monitoring steps for close contacts listed on the symptom monitoring form.   ? If you have additional questions, contact your local health department or call the epidemiologist on call at [phone] (available 24/7). ? This guidance is subject to change. For the most up-to-date guidance from [facility], please refer to their website: [email or web address]

## 2018-09-15 NOTE — ED Triage Notes (Signed)
PT sent by Louann Sjogren because he has swelling near eyes . Pt reports the swelling  Is now gown . Pt was working on an old car and rubbed his eye . Pt reports pollen causes his eye area to swell.

## 2018-09-15 NOTE — ED Notes (Signed)
Patient verbalizes understanding of discharge instructions . Opportunity for questions and answers were provided . Armband removed by staff ,Pt discharged from ED. W/C  offered at D/C  and Declined W/C at D/C and was escorted to lobby by RN.  

## 2018-09-15 NOTE — ED Triage Notes (Signed)
PT denikes any sore throat , cough  Or fever.

## 2018-12-21 ENCOUNTER — Emergency Department (HOSPITAL_COMMUNITY)
Admission: EM | Admit: 2018-12-21 | Discharge: 2018-12-21 | Disposition: A | Discharge status: Home / Self Care | Payer: BC Managed Care – PPO | Attending: Emergency Medicine | Admitting: Emergency Medicine

## 2018-12-21 ENCOUNTER — Encounter (HOSPITAL_COMMUNITY): Payer: Self-pay

## 2018-12-21 ENCOUNTER — Other Ambulatory Visit: Payer: Self-pay

## 2018-12-21 DIAGNOSIS — Z79899 Other long term (current) drug therapy: Secondary | ICD-10-CM | POA: Insufficient documentation

## 2018-12-21 DIAGNOSIS — K0889 Other specified disorders of teeth and supporting structures: Secondary | ICD-10-CM

## 2018-12-21 DIAGNOSIS — F17210 Nicotine dependence, cigarettes, uncomplicated: Secondary | ICD-10-CM | POA: Insufficient documentation

## 2018-12-21 MED ORDER — PENICILLIN V POTASSIUM 500 MG PO TABS: 500.0000 mg | ORAL_TABLET | Freq: Three times a day (TID) | ORAL | 0 refills | Status: AC

## 2018-12-21 MED ORDER — TRAMADOL HCL 50 MG PO TABS: 50.0000 mg | ORAL_TABLET | Freq: Four times a day (QID) | ORAL | 0 refills | Status: AC | PRN

## 2018-12-21 NOTE — ED Provider Notes (Signed)
[facility] DEPT Provider Note   CSN: [id number] Arrival date & time: 12/21/18  0506     History   Chief Complaint Chief Complaint  Patient presents with  . Dental Pain    HPI Thomas Ali is a 38 y.o. male.     Patient is a 38 year old male presenting with complaints of dental pain.  This is been apparently been ongoing for the past several days.  Pain is to the left upper rear molar.  He denies any fevers or chills.  He denies any difficulty breathing or swallowing.  The history is provided by the patient.  Dental Pain Location:  Upper Upper teeth location:  15/LU 2nd molar and 14/LU 1st molar Quality:  Throbbing Severity:  Severe Timing:  Constant Progression:  Worsening Chronicity:  New Relieved by:  Nothing   Past Medical History:  Diagnosis Date  . Migraine     There are no active problems to display for this patient.   History reviewed. No pertinent surgical history.      Home Medications    Prior to Admission medications   Medication Sig Start Date End Date Taking? Authorizing Provider  clindamycin (CLEOCIN) 150 MG capsule Take 2 capsules (300 mg total) by mouth 3 (three) times daily. May dispense as 150mg  capsules 10/17/15   Montine Circle, PA-C  cyclobenzaprine (FLEXERIL) 10 MG tablet Take 1 tablet (10 mg total) by mouth 2 (two) times daily as needed for muscle spasms. 07/18/18   Doristine Devoid, PA-C  ibuprofen (ADVIL,MOTRIN) 800 MG tablet Take 1 tablet (800 mg total) by mouth 3 (three) times daily. 03/08/17   Fransico Meadow, PA-C  Multiple Vitamin (MULTIVITAMIN WITH MINERALS) TABS tablet Take 1 tablet by mouth 2 (two) times daily. Centrum Multivitamin    [provider]  naproxen (NAPROSYN) 375 MG tablet Take 1 tablet (375 mg total) by mouth 2 (two) times daily. 07/18/18   Doristine Devoid, PA-C  ondansetron (ZOFRAN) 4 MG tablet Take 1 tablet (4 mg total) by mouth every 6 (six) hours. 10/17/15    Montine Circle, PA-C  traMADol (ULTRAM) 50 MG tablet Take 1 tablet (50 mg total) by mouth every 6 (six) hours as needed. 10/17/15   Montine Circle, PA-C    Family History History reviewed. No pertinent family history.  Social History Social History   Tobacco Use  . Smoking status: Current Every Day Smoker    Packs/day: 0.50    Types: Cigarettes  . Smokeless tobacco: Current User  Substance Use Topics  . Alcohol use: Yes    Comment: Socially   . Drug use: Yes    Types: Marijuana     Allergies   Patient has no known allergies.   Review of Systems Review of Systems  All other systems reviewed and are negative.    Physical Exam Updated Vital Signs BP 114/80   Pulse (!) 58   Temp 98.2 F (36.8 C) (Oral)   Resp 16   SpO2 96%   Physical Exam Vitals signs and nursing note reviewed.  Constitutional:      General: He is not in acute distress.    Appearance: Normal appearance. He is not ill-appearing.  HENT:     Head: Normocephalic and atraumatic.     Mouth/Throat:     Mouth: Mucous membranes are moist.     Comments: There is decay to the left upper rear molar with surrounding gingival inflammation.  There is no abscess noted. Pulmonary:  Effort: Pulmonary effort is normal.     Breath sounds: No stridor.  Skin:    General: Skin is warm and dry.  Neurological:     Mental Status: He is alert and oriented to person, place, and time.      ED Treatments / Results  Labs (all labs ordered are listed, but only abnormal results are displayed) Labs Reviewed - No data to display  EKG None  Radiology No results found.  Procedures Procedures (including critical care time)  Medications Ordered in ED Medications - No data to display   Initial Impression / Assessment and Plan / ED Course  I have reviewed the triage vital signs and the nursing notes.  Pertinent labs & imaging results that were available during my care of the patient were reviewed by me  and considered in my medical decision making (see chart for details).  Patient with dental pain related to dental caries.  He will be treated with antibiotics and tramadol.  He is to follow-up with dentistry.  Final Clinical Impressions(s) / ED Diagnoses   Final diagnoses:  None    ED Discharge Orders    None       Geoffery Lyonselo, Tanessa Tidd, MD 12/21/18 [phone]

## 2018-12-21 NOTE — Discharge Instructions (Addendum)
Penicillin as prescribed.  Begin taking tramadol as prescribed as needed for pain.  Follow-up with your dentist later this week if symptoms are not improving.

## 2018-12-21 NOTE — ED Triage Notes (Signed)
Pt BIB [facility] c/o dental pain in L upper mouth since Monday. A&Ox4. Ambulatory.

## 2018-12-30 ENCOUNTER — Emergency Department (HOSPITAL_COMMUNITY)
Admission: EM | Admit: 2018-12-30 | Discharge: 2018-12-30 | Disposition: A | Discharge status: Home / Self Care | Payer: BC Managed Care – PPO | Attending: Emergency Medicine | Admitting: Emergency Medicine

## 2018-12-30 ENCOUNTER — Encounter (HOSPITAL_COMMUNITY): Payer: Self-pay

## 2018-12-30 ENCOUNTER — Other Ambulatory Visit: Payer: Self-pay

## 2018-12-30 DIAGNOSIS — R51 Headache: Secondary | ICD-10-CM | POA: Insufficient documentation

## 2018-12-30 DIAGNOSIS — F17210 Nicotine dependence, cigarettes, uncomplicated: Secondary | ICD-10-CM | POA: Diagnosis not present

## 2018-12-30 DIAGNOSIS — R519 Headache, unspecified: Secondary | ICD-10-CM

## 2018-12-30 MED ORDER — DIPHENHYDRAMINE HCL 50 MG/ML IJ SOLN
12.5000 mg | Freq: Once | INTRAMUSCULAR | Status: AC
  Administered 2018-12-30: 12.5 mg via INTRAVENOUS
  Filled 2018-12-30: qty 1

## 2018-12-30 MED ORDER — METOCLOPRAMIDE HCL 5 MG/ML IJ SOLN
10.0000 mg | Freq: Once | INTRAMUSCULAR | Status: AC
  Administered 2018-12-30: 10 mg via INTRAVENOUS
  Filled 2018-12-30: qty 2

## 2018-12-30 MED ORDER — KETOROLAC TROMETHAMINE 15 MG/ML IJ SOLN
15.0000 mg | Freq: Once | INTRAMUSCULAR | Status: AC
  Administered 2018-12-30: 15 mg via INTRAVENOUS
  Filled 2018-12-30: qty 1

## 2018-12-30 NOTE — ED Triage Notes (Signed)
Pt reports migraine headache for the past two weeks that started from an infected tooth on the left side. Pt has appt on the 23rd for a root canal but pt states the pain in his head brought him to the ED today. Sensitivity to light. Denies n.v. Pt a.o, nad noted.

## 2018-12-30 NOTE — ED Provider Notes (Signed)
[facility] EMERGENCY [facility]PARTMENT Provider Note   CSN: [id number] Arrival date & time: 12/30/18  1231    History   Chief Complaint Chief Complaint  Patient presents with  . Headache    HPI Beecher McardleRaishawd Z Morss is a 38 y.o. male with a PMH of migraines presents with an intermittent left sided headache onset 2 weeks ago. Patient describes pain a nonradiating ache and states it improves with Excedrin. Patient reports pain is worse at night. Patient states headache is similar to previous migraines. Patient denies trauma. Patient denies blurry vision, neck pain, rashes, fever, chills, cough, congestion, sore throat, nausea, vomiting, or abdominal pain. Patient denies sick contacts or recent travel.      HPI  Past Medical History:  Diagnosis Date  . Migraine     There are no active problems to display for this patient.   History reviewed. No pertinent surgical history.      Home Medications    Prior to Admission medications   Medication Sig Start Date End Date Taking? Authorizing Provider  traMADol (ULTRAM) 50 MG tablet Take 1 tablet (50 mg total) by mouth every 6 (six) hours as needed. 12/21/18  Yes Delo, Riley Lamouglas, MD  penicillin v potassium (VEETID) 500 MG tablet Take 1 tablet (500 mg total) by mouth 3 (three) times daily. 12/21/18   Geoffery Lyonselo, [place], MD    Family History No family history on file.  Social History Social History   Tobacco Use  . Smoking status: Current Every Day Smoker    Packs/day: 0.50    Types: Cigarettes  . Smokeless tobacco: Current User  Substance Use Topics  . Alcohol use: Yes    Comment: Socially   . Drug use: Yes    Types: Marijuana     Allergies   Patient has no known allergies.   Review of Systems Review of Systems  Constitutional: Negative for activity change, appetite change, chills, diaphoresis, fatigue, fever and unexpected weight change.  HENT: Negative for congestion, ear pain, sinus pressure, sinus pain and sore  throat.   Eyes: Negative for photophobia and visual disturbance.  Respiratory: Negative for shortness of breath.   Cardiovascular: Negative for chest pain.  Gastrointestinal: Negative for abdominal pain, nausea and vomiting.  Musculoskeletal: Negative for gait problem, myalgias, neck pain and neck stiffness.  Skin: Negative for rash.  Allergic/Immunologic: Negative for immunocompromised state.  Neurological: Positive for headaches. Negative for dizziness, seizures, syncope, facial asymmetry, speech difficulty, weakness, light-headedness and numbness.  Hematological: Does not bruise/bleed easily.  Psychiatric/Behavioral: Negative for confusion.    Physical Exam Updated Vital Signs BP 114/87   Pulse 80   Temp 99.1 F (37.3 C) (Oral)   Resp 18   SpO2 98%   Physical Exam Vitals signs and nursing note reviewed.  Constitutional:      General: He is not in acute distress.    Appearance: He is well-developed. He is not diaphoretic.  HENT:     Head: Normocephalic and atraumatic.     Mouth/Throat:     Mouth: Mucous membranes are moist.  Eyes:     General: No scleral icterus.    Extraocular Movements: Extraocular movements intact.     Right eye: Normal extraocular motion.     Left eye: Normal extraocular motion.     Pupils: Pupils are equal, round, and reactive to light. Pupils are equal.  Neck:     Musculoskeletal: Normal range of motion.  Cardiovascular:     Rate and Rhythm: Normal rate  and regular rhythm.     Heart sounds: Normal heart sounds. No murmur. No friction rub. No gallop.   Pulmonary:     Effort: Pulmonary effort is normal. No respiratory distress.     Breath sounds: Normal breath sounds. No wheezing or rales.  Abdominal:     Palpations: Abdomen is soft.     Tenderness: There is no abdominal tenderness.  Musculoskeletal: Normal range of motion.  Skin:    General: Skin is warm.     Findings: No erythema or rash.  Neurological:     Mental Status: He is alert and  oriented to person, place, and time.    Mental Status:  Alert, oriented, thought content appropriate, able to give a coherent history. Speech fluent without evidence of aphasia. Able to follow 2 step commands without difficulty.  Cranial Nerves:  II:  Peripheral visual fields grossly normal, pupils equal, round, reactive to light III,IV, VI: ptosis not present, extra-ocular motions intact bilaterally  V,VII: smile symmetric, facial light touch sensation equal VIII: hearing grossly normal to voice  IX,X: symmetric elevation of soft palate, uvula elevates symmetrically  XI: bilateral shoulder shrug symmetric and strong XII: midline tongue extension without fassiculations Motor:  Normal tone. 5/5 in upper and lower extremities bilaterally including strong and equal grip strength and dorsiflexion/plantar flexion Sensory: Pinprick and light touch normal in all extremities.  Deep Tendon Reflexes: 2+ and symmetric in the biceps and patella Cerebellar: normal finger-to-nose with bilateral upper extremities Gait: normal gait and balance.  Negative pronator drift. Negative Romberg sign. CV: distal pulses palpable throughout    ED Treatments / Results  Labs (all labs ordered are listed, but only abnormal results are displayed) Labs Reviewed - No data to display  EKG None  Radiology No results found.  Procedures Procedures (including critical care time)  Medications Ordered in ED Medications  ketorolac (TORADOL) 15 MG/ML injection 15 mg (15 mg Intravenous Given 12/30/18 1351)  metoCLOPramide (REGLAN) injection 10 mg (10 mg Intravenous Given 12/30/18 1350)  diphenhydrAMINE (BENADRYL) injection 12.5 mg (12.5 mg Intravenous Given 12/30/18 1350)     Initial Impression / Assessment and Plan / ED Course  I have reviewed the triage vital signs and the nursing notes.  Pertinent labs & imaging results that were available during my care of the patient were reviewed by me and considered in my  medical decision making (see chart for details).  Clinical Course as of Dec 29 1552  Mon Dec 30, 2018  1553 Reassessed patient. Patient states symptoms have completely resolved and feels comfortable with discharge at this time.   [AH]    Clinical Course User Index [AH] Darlin Drop P, PA-C      Pt HA treated and improved while in ED.  Presentation is like pts typical HA and non concerning for [facility], ICH, Meningitis, or temporal arteritis. Pt is afebrile with no focal neuro deficits, nuchal rigidity, or change in vision. Pt is to follow up with PCP to discuss prophylactic medication. Pt verbalizes understanding and is agreeable with plan to dc.   Final Clinical Impressions(s) / ED Diagnoses   Final diagnoses:  Bad headache    ED Discharge Orders    None       Arville Lime, [place] 12/30/18 1555    Valarie Merino, MD 12/30/18 1824

## 2018-12-30 NOTE — ED Notes (Signed)
Patient verbalizes understanding of discharge instructions. Opportunity for questioning and answering were provided. Armband removed by staff , patient discharged from ED. 

## 2018-12-30 NOTE — Discharge Instructions (Addendum)
You have been seen today for a headache. Please read and follow all provided instructions.   1. Medications: tylenol/ibuprofen for headache, usual home medications 2. Treatment: rest, drink plenty of fluids 3. Follow Up: Please follow up with your primary doctor in 2-5 days for discussion of your diagnoses and further evaluation after today's visit; if you do not have a primary care doctor use the resource guide provided to find one; Please return to the ER for any new or worsening symptoms. Please obtain all of your results from medical records or have your doctors office obtain the results - share them with your doctor - you should be seen at your doctors office. Call today to arrange your follow up.   Take medications as prescribed. Please review all of the medicines and only take them if you do not have an allergy to them. Return to the emergency room for worsening condition or new concerning symptoms. Follow up with your regular doctor. If you don't have a regular doctor use one of the numbers below to establish a primary care doctor.  Please be aware that if you are taking birth control pills, taking other prescriptions, ESPECIALLY ANTIBIOTICS may make the birth control ineffective - if this is the case, either do not engage in sexual activity or use alternative methods of birth control such as condoms until you have finished the medicine and your family doctor says it is OK to restart them. If you are on a blood thinner such as COUMADIN, be aware that any other medicine that you take may cause the coumadin to either work too much, or not enough - you should have your coumadin level rechecked in next 7 days if this is the case.  ?  It is also a possibility that you have an allergic reaction to any of the medicines that you have been prescribed - Everybody reacts differently to medications and while MOST people have no trouble with most medicines, you may have a reaction such as nausea, vomiting,  rash, swelling, shortness of breath. If this is the case, please stop taking the medicine immediately and contact your physician.  ?  You should return to the ER if you develop severe or worsening symptoms.   Emergency Department Resource Guide 1) Find a Doctor and Pay Out of Pocket Although you won't have to find out who is covered by your insurance plan, it is a good idea to ask around and get recommendations. You will then need to call the office and see if the doctor you have chosen will accept you as a new patient and what types of options they offer for patients who are self-pay. Some doctors offer discounts or will set up payment plans for their patients who do not have insurance, but you will need to ask so you aren't surprised when you get to your appointment.  2) Contact Your Local Health Department Not all health departments have doctors that can see patients for sick visits, but many do, so it is worth a call to see if yours does. If you don't know where your local health department is, you can check in your phone book. The CDC also has a tool to help you locate your state's health department, and many state websites also have listings of all of their local health departments.  3) Find a [facility] If your illness is not likely to be very severe or complicated, you may want to try a walk in clinic. These are popping  up all over the country in pharmacies, drugstores, and shopping centers. They're usually staffed by nurse practitioners or physician assistants that have been trained to treat common illnesses and complaints. They're usually fairly quick and inexpensive. However, if you have serious medical issues or chronic medical problems, these are probably not your best option.  No Primary Care Doctor: Call Health Connect at  [phone] - they can help you locate a primary care doctor that  accepts your insurance, provides certain services, etc. Physician Referral Service-  [phone]  Chronic Pain Problems: Organization         Address  Phone   Notes  [facility]  [phone] Patients need to be referred by their primary care doctor.   Medication Assistance: Organization         Address  Phone   Notes  [facility] Medication [facility] [address] [place], [place] [address] [phone] --Must be a resident of [facility] -- Must have NO insurance coverage whatsoever (no Medicaid/ Medicare, etc.) -- The pt. MUST have a primary care doctor that directs their care regularly and follows them in the community   [facility]  [phone]   [employer]  [phone]    Agencies that provide inexpensive medical care: Organization         Address  Phone   Notes  [place]  [phone]   [facility]    [phone]   [facility] [place], [place] [address] [phone]   [place] [address] [place] [phone]   [facility]    [phone]   [facility]    [phone]   [place] [address], [place] Phone:  [phone], Fax:  [phone] Hours of Operation:  9 am - 6 pm, M-F.  Also accepts Medicaid/Medicare and self-pay.  [facility] for [place] [address], [address], [place] Phone: [phone], Fax: [phone]. Hours of Operation:  8:30 am - 5:30 pm, M-F.  Also accepts Medicaid and self-pay.  [facility] [place] [facility] [address], [place] Phone: [phone]   [place], [place], [place] [phone], Ext. [phone] Mondays & Thursdays: 7-9 AM.  First 15 patients are seen on a first come, first serve basis.    [place] Providers:  Organization         Address  Phone   Notes  [facility] [address],  [place] [phone] Also accepts self-pay patients.  [facility] [address], [place]  [phone]   [place], [address], [place] [phone]   [facility] [address], [place] [phone]   Lucianne Lei [address], [place]   [phone] Only accepts [place] patients after they have their name applied to their card.   Self-Pay (no insurance) in [facility]:  Organization         Address  Phone   Notes  Sickle Cell Patients, [facility] [place] [phone]   [facility] [place] [phone]   [facility] [place]  [address], [place] [phone]   [facility]/Dr. Osei-Bonsu  [address] [place] or [address], [place] [phone] Phone number for both [place] and [place] locations is the same.  [facility]gent [facility] and [facility] [address], [place] [phone]   [facility] [address] [place] or [address] [phone]   [facility] [address], [place] [phone], phone; [phone], fax Sees patients 1st and 3rd Saturday of every month.  Must not qualify for public or private insurance (i.e. Medicaid, Medicare, [address] Health Choice, Veterans' Benefits)  Household income should be no more than 200% of the poverty level The clinic cannot treat you if you are pregnant or think you are pregnant  Sexually transmitted diseases are not treated at the clinic.

## 2019-02-25 ENCOUNTER — Emergency Department (HOSPITAL_COMMUNITY)
Admission: EM | Admit: 2019-02-25 | Discharge: 2019-02-25 | Disposition: A | Discharge status: Home / Self Care | Payer: BC Managed Care – PPO | Attending: Emergency Medicine | Admitting: Emergency Medicine

## 2019-02-25 ENCOUNTER — Other Ambulatory Visit: Payer: Self-pay

## 2019-02-25 DIAGNOSIS — F17220 Nicotine dependence, chewing tobacco, uncomplicated: Secondary | ICD-10-CM | POA: Insufficient documentation

## 2019-02-25 DIAGNOSIS — F17210 Nicotine dependence, cigarettes, uncomplicated: Secondary | ICD-10-CM | POA: Insufficient documentation

## 2019-02-25 DIAGNOSIS — L0291 Cutaneous abscess, unspecified: Secondary | ICD-10-CM

## 2019-02-25 DIAGNOSIS — L0231 Cutaneous abscess of buttock: Secondary | ICD-10-CM | POA: Diagnosis not present

## 2019-02-25 MED ORDER — LIDOCAINE HCL URETHRAL/MUCOSAL 2 % EX GEL
1.0000 "application " | Freq: Once | CUTANEOUS | Status: AC
  Administered 2019-02-25: 1 via TOPICAL
  Filled 2019-02-25: qty 20

## 2019-02-25 MED ORDER — LIDOCAINE HCL 2 % IJ SOLN
10.0000 mL | Freq: Once | INTRAMUSCULAR | Status: DC
  Filled 2019-02-25: qty 20

## 2019-02-25 MED ORDER — HYDROCODONE-ACETAMINOPHEN 5-325 MG PO TABS
1.0000 | ORAL_TABLET | Freq: Once | ORAL | Status: AC
  Administered 2019-02-25: 1 via ORAL
  Filled 2019-02-25: qty 1

## 2019-02-25 NOTE — ED Triage Notes (Signed)
Patient reports abscess to L buttock since yesterday, reports having similar before. Denies fevers/chills. Patient prefers standing d/t comfort.

## 2019-02-25 NOTE — Discharge Instructions (Signed)
Please follow up with your primary care provider within 5-7 days for re-evaluation of your symptoms. If you do not have a primary care provider, information for a healthcare clinic has been provided for you to make arrangements for follow up care.  Please return to the emergency room immediately if you experience any new or worsening symptoms or any symptoms that indicate worsening infection such as fevers, increased redness/swelling/pain, warmth, or drainage from the affected area.    

## 2019-02-25 NOTE — ED Provider Notes (Signed)
[facility]   CSN: [id number] Arrival date & time: 02/25/19  0818     History   Chief Complaint Chief Complaint  Patient presents with  . Abscess    HPI Thomas Ali is a 38 y.o. male.     HPI   Pt is a 38 y/o male who presents to the ED today for eval of abscess. He states that he has an abscess to the left buttock that he noticed yesterday. Pain has been constant since onset. Pain is rated 10/10. He noticed that he has some swelling to the area. Denies fevers, chills, cough, vomiting. He states he has had similar sxs in the past. He states he changed to a new body wash last week.   Past Medical History:  Diagnosis Date  . Migraine     There are no active problems to display for this patient.   No past surgical history on file.      Home Medications    Prior to Admission medications   Medication Sig Start Date End Date Taking? Authorizing Provider  penicillin v potassium (VEETID) 500 MG tablet Take 1 tablet (500 mg total) by mouth 3 (three) times daily. 12/21/18   Geoffery Lyonselo, [place], MD  traMADol (ULTRAM) 50 MG tablet Take 1 tablet (50 mg total) by mouth every 6 (six) hours as needed. 12/21/18   Geoffery Lyonselo, Douglas, MD    Family History No family history on file.  Social History Social History   Tobacco Use  . Smoking status: Current Every Day Smoker    Packs/day: 0.50    Types: Cigarettes  . Smokeless tobacco: Current User  Substance Use Topics  . Alcohol use: Yes    Comment: Socially   . Drug use: Yes    Types: Marijuana     Allergies   Patient has no known allergies.   Review of Systems Review of Systems  Constitutional: Negative for fever.  Respiratory: Negative for shortness of breath.   Cardiovascular: Negative for chest pain.  Gastrointestinal: Negative for abdominal pain, diarrhea, nausea and vomiting.  Genitourinary: Negative for dysuria.  Musculoskeletal: Negative for back pain.  Skin:      abscess  Neurological: Negative for headaches.     Physical Exam Updated Vital Signs BP 122/79 (BP Location: Right Arm)   Pulse (!) 115   Temp 99.4 F (37.4 C) (Oral)   Resp 16   SpO2 99%   Physical Exam Constitutional:      General: He is not in acute distress.    Appearance: He is well-developed.  Eyes:     Conjunctiva/sclera: Conjunctivae normal.  Cardiovascular:     Rate and Rhythm: Normal rate and regular rhythm.  Pulmonary:     Effort: Pulmonary effort is normal.     Breath sounds: Normal breath sounds.  Genitourinary:    Comments: Chaperone present.  2 cm x 2 cm area of fluctuance to the left lateral buttock just inferior to the rectum.  There is no tracking of induration/fluctuance toward the scrotum or rectum. Skin:    General: Skin is warm and dry.  Neurological:     Mental Status: He is alert and oriented to person, place, and time.      ED Treatments / Results  Labs (all labs ordered are listed, but only abnormal results are displayed) Labs Reviewed - No data to display  EKG None  Radiology No results found.  Procedures .Marland Kitchen.Incision and Drainage  Date/Time: 02/25/2019 11:01  AM Performed by: Rodney Booze, PA-C Authorized by: Rodney Booze, PA-C   Consent:    Consent obtained:  Verbal   Consent given by:  Patient   Risks discussed:  Bleeding, incomplete drainage and pain   Alternatives discussed:  No treatment Location:    Type:  Abscess   Location:  Anogenital   Anogenital location:  Perianal Pre-procedure details:    Skin preparation:  Betadine Anesthesia (see MAR for exact dosages):    Anesthesia method:  Topical application and local infiltration   Topical anesthetic:  Lidocaine gel   Local anesthetic:  Lidocaine 2% w/o epi Procedure type:    Complexity:  Simple Procedure details:    Incision types:  Stab incision   Incision depth:  Dermal   Scalpel blade:  11   Wound management:  Probed and deloculated   Drainage:   Purulent   Drainage amount:  Moderate   Wound treatment:  Wound left open Post-procedure details:    Patient tolerance of procedure:  Tolerated well, no immediate complications   (including critical care time)  Medications Ordered in ED Medications  lidocaine (XYLOCAINE) 2 % (with pres) injection 200 mg (has no administration in time range)  lidocaine (XYLOCAINE) 2 % jelly 1 application (1 application Topical Given 02/25/19 1004)     Initial Impression / Assessment and Plan / ED Course  I have reviewed the triage vital signs and the nursing notes.  Pertinent labs & imaging results that were available during my care of the patient were reviewed by me and considered in my medical decision making (see chart for details).    Final Clinical Impressions(s) / ED Diagnoses   Final diagnoses:  Abscess   Patient with perianal skin abscess amenable to incision and drainage.  Abscess was not large enough to warrant packing or drain,  wound recheck in 2 days. Encouraged home warm soaks and flushing.  Mild signs of cellulitis is surrounding skin.  Will d/c to home.  No antibiotic therapy is indicated.   ED Discharge Orders    None       Rodney Booze, [place] 02/25/19 1103    Tegeler, Gwenyth Allegra, MD 02/25/19 [phone]

## 2019-12-18 ENCOUNTER — Encounter (HOSPITAL_COMMUNITY): Payer: Self-pay | Admitting: Emergency Medicine

## 2019-12-18 ENCOUNTER — Emergency Department (HOSPITAL_COMMUNITY): Payer: BC Managed Care – PPO

## 2019-12-18 ENCOUNTER — Emergency Department (HOSPITAL_COMMUNITY)
Admission: EM | Admit: 2019-12-18 | Discharge: 2019-12-19 | Disposition: A | Discharge status: Home / Self Care | Payer: BC Managed Care – PPO | Attending: Emergency Medicine | Admitting: Emergency Medicine

## 2019-12-18 DIAGNOSIS — F10920 Alcohol use, unspecified with intoxication, uncomplicated: Secondary | ICD-10-CM | POA: Insufficient documentation

## 2019-12-18 DIAGNOSIS — Z23 Encounter for immunization: Secondary | ICD-10-CM | POA: Diagnosis not present

## 2019-12-18 DIAGNOSIS — Y92410 Unspecified street and highway as the place of occurrence of the external cause: Secondary | ICD-10-CM | POA: Diagnosis not present

## 2019-12-18 DIAGNOSIS — S8001XA Contusion of right knee, initial encounter: Secondary | ICD-10-CM | POA: Insufficient documentation

## 2019-12-18 DIAGNOSIS — Y9389 Activity, other specified: Secondary | ICD-10-CM | POA: Insufficient documentation

## 2019-12-18 DIAGNOSIS — S5012XA Contusion of left forearm, initial encounter: Secondary | ICD-10-CM | POA: Diagnosis not present

## 2019-12-18 DIAGNOSIS — Y999 Unspecified external cause status: Secondary | ICD-10-CM | POA: Insufficient documentation

## 2019-12-18 DIAGNOSIS — S01111A Laceration without foreign body of right eyelid and periocular area, initial encounter: Secondary | ICD-10-CM | POA: Insufficient documentation

## 2019-12-18 DIAGNOSIS — S0990XA Unspecified injury of head, initial encounter: Secondary | ICD-10-CM | POA: Diagnosis present

## 2019-12-18 LAB — CBC WITH DIFFERENTIAL/PLATELET
Abs Immature Granulocytes: 0.02 10*3/uL (ref 0.00–0.07)
Basophils Absolute: 0.1 10*3/uL (ref 0.0–0.1)
Basophils Relative: 1 %
Eosinophils Absolute: 0.2 10*3/uL (ref 0.0–0.5)
Eosinophils Relative: 3 %
HCT: 42.3 % (ref 39.0–52.0)
Hemoglobin: 14.6 g/dL (ref 13.0–17.0)
Immature Granulocytes: 0 %
Lymphocytes Relative: 36 %
Lymphs Abs: 1.9 10*3/uL (ref 0.7–4.0)
MCH: 33.2 pg (ref 26.0–34.0)
MCHC: 34.5 g/dL (ref 30.0–36.0)
MCV: 96.1 fL (ref 80.0–100.0)
Monocytes Absolute: 0.6 10*3/uL (ref 0.1–1.0)
Monocytes Relative: 11 %
Neutro Abs: 2.6 10*3/uL (ref 1.7–7.7)
Neutrophils Relative %: 49 %
Platelets: 183 10*3/uL (ref 150–400)
RBC: 4.4 MIL/uL (ref 4.22–5.81)
RDW: 11.8 % (ref 11.5–15.5)
WBC: 5.2 10*3/uL (ref 4.0–10.5)
nRBC: 0 % (ref 0.0–0.2)

## 2019-12-18 LAB — COMPREHENSIVE METABOLIC PANEL
ALT: 21 U/L (ref 0–44)
AST: 31 U/L (ref 15–41)
Albumin: 4.4 g/dL (ref 3.5–5.0)
Alkaline Phosphatase: 63 U/L (ref 38–126)
Anion gap: 12 (ref 5–15)
BUN: 8 mg/dL (ref 6–20)
CO2: 21 mmol/L — ABNORMAL LOW (ref 22–32)
Calcium: 8.9 mg/dL (ref 8.9–10.3)
Chloride: 108 mmol/L (ref 98–111)
Creatinine, Ser: 0.98 mg/dL (ref 0.61–1.24)
GFR calc Af Amer: >60 mL/min (ref >60)
GFR calc non Af Amer: >60 mL/min (ref >60)
Glucose, Bld: 89 mg/dL (ref 70–99)
Potassium: 3.6 mmol/L (ref 3.5–5.1)
Sodium: 141 mmol/L (ref 135–145)
Total Bilirubin: 1 mg/dL (ref 0.3–1.2)
Total Protein: 7.1 g/dL (ref 6.5–8.1)

## 2019-12-18 LAB — SAMPLE TO BLOOD BANK

## 2019-12-18 LAB — ETHANOL: Alcohol, Ethyl (B): 322 mg/dL (ref <10)

## 2019-12-18 MED ORDER — TETANUS-DIPHTH-ACELL PERTUSSIS 5-2.5-18.5 LF-MCG/0.5 IM SUSP
0.5000 mL | Freq: Once | INTRAMUSCULAR | Status: AC
  Administered 2019-12-19: 0.5 mL via INTRAMUSCULAR
  Filled 2019-12-18: qty 0.5

## 2019-12-18 MED ORDER — IOHEXOL 300 MG/ML  SOLN
100.0000 mL | Freq: Once | INTRAMUSCULAR | Status: AC | PRN
  Administered 2019-12-18: 100 mL via INTRAVENOUS
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
## 2019-12-18 NOTE — ED Notes (Signed)
Abrasion and hematoma also noted to L FA

## 2019-12-19 ENCOUNTER — Emergency Department (HOSPITAL_COMMUNITY): Payer: BC Managed Care – PPO

## 2019-12-19 ENCOUNTER — Other Ambulatory Visit: Payer: Self-pay

## 2019-12-19 LAB — RAPID URINE DRUG SCREEN, HOSP PERFORMED
Amphetamines: NOT DETECTED
Barbiturates: NOT DETECTED
Benzodiazepines: NOT DETECTED
Cocaine: NOT DETECTED
Opiates: NOT DETECTED
Tetrahydrocannabinol: POSITIVE — AB

## 2019-12-19 MED ORDER — IBUPROFEN 800 MG PO TABS: 800.0000 mg | ORAL_TABLET | Freq: Three times a day (TID) | ORAL | 0 refills | Status: AC

## 2019-12-19 MED ORDER — METHOCARBAMOL 500 MG PO TABS: 500.0000 mg | ORAL_TABLET | Freq: Two times a day (BID) | ORAL | 0 refills | Status: AC
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
## 2019-12-19 NOTE — ED Notes (Signed)
SO at bedside. Updated on plan of care

## 2019-12-19 NOTE — Discharge Instructions (Signed)
Take the prescribed medication as directed-- sent to your pharmacy for you. Take it easy, can apply ice to arm/head if any continued swelling. Follow-up with your primary care doctor. Return to the ED for new or worsening symptoms.

## 2020-07-12 ENCOUNTER — Other Ambulatory Visit: Payer: BC Managed Care – PPO
